# Patient Record
Sex: Male | Born: 1965 | Race: White | Hispanic: No | Smoking: Never smoker
Health system: Southern US, Community
[De-identification: ages and names within clinical notes are randomized; demographics above are authoritative.]

## PROBLEM LIST (undated history)

## (undated) DIAGNOSIS — I10 Essential (primary) hypertension: Secondary | ICD-10-CM

## (undated) DIAGNOSIS — E119 Type 2 diabetes mellitus without complications: Secondary | ICD-10-CM

## (undated) HISTORY — PX: HERNIA REPAIR: SHX51

---

## 1996-05-15 HISTORY — PX: WISDOM TOOTH EXTRACTION: SHX21

## 2014-03-23 ENCOUNTER — Ambulatory Visit: Payer: Self-pay | Admitting: Family Medicine

## 2014-07-22 ENCOUNTER — Ambulatory Visit: Payer: Self-pay | Admitting: Physician Assistant

## 2015-12-22 ENCOUNTER — Ambulatory Visit
Admission: EM | Admit: 2015-12-22 | Discharge: 2015-12-22 | Disposition: A | Discharge status: Home / Self Care | Payer: Self-pay | Attending: Family Medicine | Admitting: Family Medicine

## 2015-12-22 DIAGNOSIS — S0501XA Injury of conjunctiva and corneal abrasion without foreign body, right eye, initial encounter: Secondary | ICD-10-CM

## 2015-12-22 MED ORDER — TETRACAINE HCL 0.5 % OP SOLN: 1.0000 [drp] | Freq: Once | OPHTHALMIC | Status: DC

## 2015-12-22 MED ORDER — FLUORESCEIN SODIUM 1 MG OP STRP: 1.0000 | ORAL_STRIP | Freq: Once | OPHTHALMIC | Status: DC

## 2015-12-22 MED ORDER — ERYTHROMYCIN 5 MG/GM OP OINT: TOPICAL_OINTMENT | OPHTHALMIC | 0 refills | Status: DC

## 2015-12-22 NOTE — Discharge Instructions (Signed)
Use Erythromycin ointment three times a day in the right eye to help prevent infection. Recommend follow-up with an eye doctor if symptoms do not improve within 3 days.

## 2015-12-22 NOTE — ED Provider Notes (Signed)
CSN: [id number]     Arrival date & time 12/22/15  7829 History   First MD Initiated Contact with Patient 12/22/15 1112     Chief Complaint  Patient presents with  . Foreign Body in Eye   (Consider location/radiation/quality/duration/timing/severity/associated sxs/prior Treatment) Patient presents with right eye irritation. Was working with scrap metal at work yesterday when he thought something got into his right eye. He wiped away small dark sand-like particles but still feels like something is in his eye, especially when he blinks. He has not put any eye drops in his eye. He does not wear contacts.    The history is provided by the patient.  Foreign Body in Eye  This is a new problem. The current episode started 12 to 24 hours ago. The problem occurs constantly. The problem has not changed since onset.He has tried nothing for the symptoms.    History reviewed. No pertinent past medical history. Past Surgical History:  Procedure Laterality Date  . HERNIA REPAIR     History reviewed. No pertinent family history. Social History  Substance Use Topics  . Smoking status: Never Smoker  . Smokeless tobacco: Never Used  . Alcohol use Yes    Review of Systems  Eyes: Positive for pain and redness.    Allergies  Review of patient's allergies indicates no known allergies.  Home Medications   Prior to Admission medications   Medication Sig Start Date End Date Taking? Authorizing Provider  erythromycin ophthalmic ointment Place a 1/2 inch ribbon of ointment into the right lower eyelid three times a day for 5 days. 12/22/15   Sudie Grumbling, NP   Meds Ordered and Administered this Visit   Medications - No data to display  BP (!) 151/84 (BP Location: Right Arm)   Pulse 78   Temp 98 F (36.7 C) (Tympanic)   Resp 18   Ht  (1.803 m)   Wt 210 lb (95.3 kg)   SpO2 98%   BMI 29.29 kg/m  No data found.   Physical Exam  Constitutional: He appears well-developed and  well-nourished.  HENT:  Head: Normocephalic and atraumatic.  Nose: Nose normal.  Mouth/Throat: Oropharynx is clear and moist.  Eyes: EOM and lids are normal. Pupils are equal, round, and reactive to light. Lids are everted and swept, no foreign bodies found. Right eye exhibits no discharge (clear tears). Right conjunctiva is injected. Left conjunctiva is not injected.  Slit lamp exam:      The right eye shows corneal abrasion.    Small Corneal abrasion present at 12 o'clock just above pupil. Also tiny abrasions present  lateral around 3 o'clock near tear duct. No distinct foreign bodies seen.   Neck: Normal range of motion. Neck supple.  Cardiovascular: Normal rate and regular rhythm.   Skin: Skin is warm and dry.  Psychiatric: He has a normal mood and affect. His behavior is normal. Judgment and thought content normal.    Urgent Care Course   Clinical Course    Procedures (including critical care time) Applied 2 drops of Tetracaine into right eye. Used fluorescein dye and observed eye with black light. Flushed eye with 2 cc of normal saline.   Labs Review Labs Reviewed - No data to display  Imaging Review No results found.   Visual Acuity Review  Right Eye Distance: 20/20 Left Eye Distance: 20/20 Bilateral Distance: 20/20  Right Eye Near:   Left Eye Near:    Bilateral Near:  MDM   1. Corneal abrasion, right, initial encounter   2. Incidental finding- elevated blood pressure  Discussed findings with patient. No visible foreign bodies still seen. Recommend use Erythromycin ointment 3 times a day for the next 5 days to prevent infection from abrasion. Encouraged to wear protective glasses when working to prevent eye injuries in the future. Patient did not need note for work today. Recommend follow-up with an eye doctor if symptoms do not resolve within 2 to 3 days.  Briefly discussed elevated blood pressure. Recommend recheck BP 2 to 3 times within the next  week. If still elevated, patient needs to see his PCP.     Sudie GrumblingAnn Berry Cordae Mccarey, NP 12/23/15 0020

## 2015-12-22 NOTE — ED Triage Notes (Signed)
Patient was throwing scrap steel at work and something entered his eye yesterday.

## 2017-06-04 ENCOUNTER — Ambulatory Visit: Payer: Self-pay | Admitting: *Deleted

## 2018-04-22 ENCOUNTER — Other Ambulatory Visit: Payer: Self-pay | Admitting: Internal Medicine

## 2018-04-22 ENCOUNTER — Ambulatory Visit (INDEPENDENT_AMBULATORY_CARE_PROVIDER_SITE_OTHER): Payer: Worker's Compensation

## 2018-04-22 ENCOUNTER — Encounter: Payer: Self-pay | Admitting: Emergency Medicine

## 2018-04-22 ENCOUNTER — Ambulatory Visit
Admission: EM | Admit: 2018-04-22 | Discharge: 2018-04-22 | Disposition: A | Discharge status: Home / Self Care | Payer: Worker's Compensation | Attending: Family Medicine | Admitting: Family Medicine

## 2018-04-22 ENCOUNTER — Other Ambulatory Visit: Payer: Self-pay

## 2018-04-22 DIAGNOSIS — W228XXA Striking against or struck by other objects, initial encounter: Secondary | ICD-10-CM | POA: Diagnosis not present

## 2018-04-22 DIAGNOSIS — S6991XA Unspecified injury of right wrist, hand and finger(s), initial encounter: Secondary | ICD-10-CM

## 2018-04-22 HISTORY — DX: Type 2 diabetes mellitus without complications: E11.9

## 2018-04-22 MED ORDER — MELOXICAM 15 MG PO TABS: 15.0000 mg | ORAL_TABLET | Freq: Every day | ORAL | 0 refills | Status: DC | PRN

## 2018-04-22 NOTE — Discharge Instructions (Signed)
Medication as prescribed.  Take care  Dr. Chalee Hirota  

## 2018-04-22 NOTE — ED Provider Notes (Signed)
[facility]    CSN: [id number] Arrival date & time: 04/22/18  1406  History   Chief Complaint Chief Complaint  Patient presents with  . Finger Injury    DOI 04/08/18   HPI  52 year old male presents with right thumb pain.  Patient states that he hyperextended his right thumb accidentally on 11/25.  Occurred at work.  He states that his glove got caught and bent his thumb backwards.  Patient states that he was doing okay but hit his thumb today at work.  Pain is mild.  Worse with activity.  No relieving factors.  No other complaints.  History reviewed as below. Past Medical History:  Diagnosis Date  . Diabetes mellitus without complication [facility])    Past Surgical History:  Procedure Laterality Date  . HERNIA REPAIR     Home Medications    Prior to Admission medications   Medication Sig Start Date End Date Taking? Authorizing Provider  metFORMIN (GLUCOPHAGE) 1000 MG tablet Take by mouth. 01/18/18  Yes [provider]  JARDIANCE 25 MG TABS tablet  04/22/18   [provider]  lisinopril (PRINIVIL,ZESTRIL) 20 MG tablet  04/10/18   [provider]  meloxicam (MOBIC) 15 MG tablet Take 1 tablet (15 mg total) by mouth daily as needed. 04/22/18   Shelley Pooley G, DO  TRULICITY 0.75 MG/0.5ML SOPN INJECT 0.75 MG SUBCUTANEOUSLY Q 7 DAYS 04/10/18   [provider]   Social History Social History   Tobacco Use  . Smoking status: Never Smoker  . Smokeless tobacco: Never Used  Substance Use Topics  . Alcohol use: Yes  . Drug use: No     Allergies   Patient has no known allergies.   Review of Systems Review of Systems  Constitutional: Negative.   Musculoskeletal:       Right thumb pain.   Physical Exam Triage Vital Signs ED Triage Vitals  Enc Vitals Group     BP 04/22/18 1434 136/81     Pulse Rate 04/22/18 1434 93     Resp 04/22/18 1434 18     Temp 04/22/18 1434 98.8 F (37.1 C)     Temp Source 04/22/18 1434 Oral     SpO2  04/22/18 1434 97 %     Weight 04/22/18 1431 220 lb (99.8 kg)     Height 04/22/18 1431 5\' 10"  (1.778 m)     Head Circumference --      Peak Flow --      Pain Score 04/22/18 1431 1     Pain Loc --      Pain Edu? --      Excl. in GC? --    Updated Vital Signs BP 136/81 (BP Location: Right Arm)   Pulse 93   Temp 98.8 F (37.1 C) (Oral)   Resp 18   Ht 5\' 10"  (1.778 m)   Wt 99.8 kg   SpO2 97%   BMI 31.57 kg/m   Visual Acuity Right Eye Distance:   Left Eye Distance:   Bilateral Distance:    Right Eye Near:   Left Eye Near:    Bilateral Near:     Physical Exam  Constitutional: He is oriented to person, place, and time. He appears well-developed. No distress.  HENT:  Head: Normocephalic and atraumatic.  Pulmonary/Chest: Effort normal. No respiratory distress.  Musculoskeletal:  Right thumb -no swelling.  No erythema.  No discrete areas of tenderness.  Neurological: He is alert and oriented to person, place, and  time.  Psychiatric: He has a normal mood and affect. His behavior is normal.  Nursing note and vitals reviewed.  UC Treatments / Results  Labs (all labs ordered are listed, but only abnormal results are displayed) Labs Reviewed - No data to display  EKG None  Radiology Dg Finger Thumb Right  Result Date: 04/22/2018 CLINICAL DATA:  52 year old male hyperflexion injury 2 weeks ago with continued 1st MCP and IP pain. EXAM: RIGHT THUMB 2+V COMPARISON:  Right hand series 07/22/2014. FINDINGS: Three views of the right thumb. Bone mineralization is within normal limits. There is no evidence of fracture or dislocation. There is no evidence of arthropathy or other focal bone abnormality. Soft tissues are unremarkable IMPRESSION: Negative. Electronically Signed   By: Odessa FlemingH  Hall M.D.   On: 04/22/2018 14:56    Procedures Procedures (including critical care time)  Medications Ordered in UC Medications - No data to display  Initial Impression / Assessment and Plan / UC  Course  I have reviewed the triage vital signs and the nursing notes.  Pertinent labs & imaging results that were available during my care of the patient were reviewed by me and considered in my medical decision making (see chart for details).    52 year old male presents with a thumb injury.  X-ray negative.  Meloxicam as prescribed.  May return to work.  Final Clinical Impressions(s) / UC Diagnoses   Final diagnoses:  Injury of right thumb, initial encounter     Discharge Instructions     Medication as prescribed.  Take care  Dr. Adriana Simasook    ED Prescriptions    Medication Sig Dispense Auth. Provider   meloxicam (MOBIC) 15 MG tablet Take 1 tablet (15 mg total) by mouth daily as needed. 30 tablet Tommie Samsook, Arabell Neria G, DO     Controlled Substance Prescriptions [place] Controlled Substance Registry consulted? Not Applicable   Tommie SamsCook, Jakub Debold G, DO 04/22/18 2043

## 2018-04-22 NOTE — ED Triage Notes (Signed)
Patient c/o right thumb injury on 11/25 while at work. Patient states he got his glove caught and he bent his thumb back. Patient continues to have pain.

## 2020-02-04 ENCOUNTER — Emergency Department
Admission: EM | Admit: 2020-02-04 | Discharge: 2020-02-04 | Disposition: A | Discharge status: Home / Self Care | Payer: BC Managed Care – PPO | Attending: Emergency Medicine | Admitting: Emergency Medicine

## 2020-02-04 ENCOUNTER — Other Ambulatory Visit: Payer: Self-pay

## 2020-02-04 DIAGNOSIS — E119 Type 2 diabetes mellitus without complications: Secondary | ICD-10-CM | POA: Diagnosis not present

## 2020-02-04 DIAGNOSIS — K0889 Other specified disorders of teeth and supporting structures: Secondary | ICD-10-CM | POA: Diagnosis not present

## 2020-02-04 DIAGNOSIS — Z7984 Long term (current) use of oral hypoglycemic drugs: Secondary | ICD-10-CM | POA: Diagnosis not present

## 2020-02-04 DIAGNOSIS — Z79899 Other long term (current) drug therapy: Secondary | ICD-10-CM | POA: Insufficient documentation

## 2020-02-04 DIAGNOSIS — R55 Syncope and collapse: Secondary | ICD-10-CM | POA: Diagnosis not present

## 2020-02-04 LAB — CBC
HCT: 43 % (ref 39.0–52.0)
Hemoglobin: 15.1 g/dL (ref 13.0–17.0)
MCH: 30.7 pg (ref 26.0–34.0)
MCHC: 35.1 g/dL (ref 30.0–36.0)
MCV: 87.4 fL (ref 80.0–100.0)
Platelets: 332 10*3/uL (ref 150–400)
RBC: 4.92 MIL/uL (ref 4.22–5.81)
RDW: 11.9 % (ref 11.5–15.5)
WBC: 15.8 10*3/uL — ABNORMAL HIGH (ref 4.0–10.5)
nRBC: 0 % (ref 0.0–0.2)

## 2020-02-04 LAB — URINALYSIS, COMPLETE (UACMP) WITH MICROSCOPIC
Bacteria, UA: NONE SEEN
Bilirubin Urine: NEGATIVE
Glucose, UA: 50 mg/dL — AB
Hgb urine dipstick: NEGATIVE
Ketones, ur: 80 mg/dL — AB
Leukocytes,Ua: NEGATIVE
Nitrite: NEGATIVE
Protein, ur: NEGATIVE mg/dL
Specific Gravity, Urine: 1.036 — ABNORMAL HIGH (ref 1.005–1.030)
Squamous Epithelial / HPF: NONE SEEN (ref 0–5)
pH: 5 (ref 5.0–8.0)

## 2020-02-04 LAB — BASIC METABOLIC PANEL
Anion gap: 15 (ref 5–15)
BUN: 20 mg/dL (ref 6–20)
CO2: 21 mmol/L — ABNORMAL LOW (ref 22–32)
Calcium: 9.1 mg/dL (ref 8.9–10.3)
Chloride: 102 mmol/L (ref 98–111)
Creatinine, Ser: 1.12 mg/dL (ref 0.61–1.24)
GFR calc Af Amer: >60 mL/min (ref >60)
GFR calc non Af Amer: >60 mL/min (ref >60)
Glucose, Bld: 137 mg/dL — ABNORMAL HIGH (ref 70–99)
Potassium: 4.6 mmol/L (ref 3.5–5.1)
Sodium: 138 mmol/L (ref 135–145)

## 2020-02-04 MED ORDER — TRAMADOL HCL 50 MG PO TABS
50.0000 mg | ORAL_TABLET | Freq: Once | ORAL | Status: AC
  Administered 2020-02-04: 50 mg via ORAL
  Filled 2020-02-04: qty 1

## 2020-02-04 NOTE — Discharge Instructions (Addendum)
Your exam and labs are essentially normal at this time. Your elevated WBCs are likely due to your recent dental procedure. Take the prescribed pain medicine or Extra Strength Tylenol for non-drowsy pain relief. Follow-up with your dental provider or return if needed.
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
## 2020-05-05 ENCOUNTER — Other Ambulatory Visit: Payer: Self-pay

## 2020-05-05 ENCOUNTER — Encounter: Payer: Self-pay | Admitting: *Deleted

## 2020-05-05 DIAGNOSIS — R079 Chest pain, unspecified: Secondary | ICD-10-CM | POA: Diagnosis present

## 2020-05-05 DIAGNOSIS — I10 Essential (primary) hypertension: Secondary | ICD-10-CM | POA: Insufficient documentation

## 2020-05-05 DIAGNOSIS — K5900 Constipation, unspecified: Secondary | ICD-10-CM | POA: Diagnosis not present

## 2020-05-05 DIAGNOSIS — E119 Type 2 diabetes mellitus without complications: Secondary | ICD-10-CM | POA: Insufficient documentation

## 2020-05-05 DIAGNOSIS — R202 Paresthesia of skin: Secondary | ICD-10-CM | POA: Insufficient documentation

## 2020-05-05 DIAGNOSIS — Z7984 Long term (current) use of oral hypoglycemic drugs: Secondary | ICD-10-CM | POA: Insufficient documentation

## 2020-05-05 DIAGNOSIS — Z79899 Other long term (current) drug therapy: Secondary | ICD-10-CM | POA: Insufficient documentation

## 2020-05-05 LAB — CBC
HCT: 43.7 % (ref 39.0–52.0)
Hemoglobin: 15.2 g/dL (ref 13.0–17.0)
MCH: 31.3 pg (ref 26.0–34.0)
MCHC: 34.8 g/dL (ref 30.0–36.0)
MCV: 90.1 fL (ref 80.0–100.0)
Platelets: 321 10*3/uL (ref 150–400)
RBC: 4.85 MIL/uL (ref 4.22–5.81)
RDW: 11.7 % (ref 11.5–15.5)
WBC: 10.1 10*3/uL (ref 4.0–10.5)
nRBC: 0 % (ref 0.0–0.2)

## 2020-05-05 NOTE — ED Triage Notes (Signed)
Pain in the central chest and on the left lateral chest that "last for seconds at a time" since yesterday. Tingling in the left arm. Has had some nausea.

## 2020-05-06 ENCOUNTER — Emergency Department
Admission: EM | Admit: 2020-05-06 | Discharge: 2020-05-06 | Disposition: A | Discharge status: Home / Self Care | Payer: BC Managed Care – PPO | Attending: Emergency Medicine | Admitting: Emergency Medicine

## 2020-05-06 ENCOUNTER — Emergency Department: Payer: BC Managed Care – PPO

## 2020-05-06 DIAGNOSIS — R079 Chest pain, unspecified: Secondary | ICD-10-CM

## 2020-05-06 LAB — BASIC METABOLIC PANEL
Anion gap: 12 (ref 5–15)
BUN: 29 mg/dL — ABNORMAL HIGH (ref 6–20)
CO2: 24 mmol/L (ref 22–32)
Calcium: 9.4 mg/dL (ref 8.9–10.3)
Chloride: 100 mmol/L (ref 98–111)
Creatinine, Ser: 1.4 mg/dL — ABNORMAL HIGH (ref 0.61–1.24)
GFR, Estimated: >60 mL/min (ref >60)
Glucose, Bld: 123 mg/dL — ABNORMAL HIGH (ref 70–99)
Potassium: 4.3 mmol/L (ref 3.5–5.1)
Sodium: 136 mmol/L (ref 135–145)

## 2020-05-06 LAB — TROPONIN I (HIGH SENSITIVITY)
Troponin I (High Sensitivity): 3 ng/L (ref <18)
Troponin I (High Sensitivity): 4 ng/L (ref <18)

## 2020-05-06 MED ORDER — PANTOPRAZOLE SODIUM 40 MG PO TBEC: 40.0000 mg | DELAYED_RELEASE_TABLET | Freq: Every day | ORAL | 0 refills | Status: DC

## 2020-05-06 NOTE — ED Notes (Signed)
Patient discharged to home per MD order. Patient in stable condition, and deemed medically cleared by ED provider for discharge. Discharge instructions reviewed with patient/family using "Teach Back"; verbalized understanding of medication education and administration, and information about follow-up care. Denies further concerns. ° °

## 2020-05-06 NOTE — ED Provider Notes (Signed)
[facility] Emergency Department Provider Note  ____________________________________________   None    (approximate)  I have reviewed the triage vital signs and the nursing notes.   HISTORY  Chief Complaint Chest Pain   HPI Alfred Cortez is a 54 y.o. male with a past medical history of HTN and DM who presents for assessment of approximately week of intermittent left-sided chest pain with some radiation to left arm and some paresthesias in left arm.  Patient states that a couple times over the last week he has woken at night with some burning in his chest and some nausea.  He has a states he has been having some constipation.  He states he is intermittently had some abdominal discomfort of past week but does not currently have any abdominal pain.  He denies any headache, earache, sore throat, fevers, chills, cough, shortness of breath, back pain, urinary symptoms, blood in stool, rash or extremity pain.  He endorses remote tobacco abuse but is not a current smoker or daily drinker and does not use illegal drugs.  No other acute concerns at this time.  Pain is not clearly exertional, positional or precipitated by any specific movements.         Past Medical History:  Diagnosis Date  . Diabetes mellitus without complication (HCC)     There are no problems to display for this patient.   Past Surgical History:  Procedure Laterality Date  . HERNIA REPAIR    . WISDOM TOOTH EXTRACTION  1998    Prior to Admission medications   Medication Sig Start Date End Date Taking? Authorizing Provider  JARDIANCE 25 MG TABS tablet  04/22/18   [provider]  lisinopril (PRINIVIL,ZESTRIL) 20 MG tablet  04/10/18   [provider]  meloxicam (MOBIC) 15 MG tablet Take 1 tablet (15 mg total) by mouth daily as needed. 04/22/18   Tommie Sams, DO  metFORMIN (GLUCOPHAGE) 1000 MG tablet Take by mouth. 01/18/18   [provider]  pantoprazole (PROTONIX)  40 MG tablet Take 1 tablet (40 mg total) by mouth daily. 05/06/20 06/05/20  Gilles Chiquito, MD  TRULICITY 0.75 MG/0.5ML SOPN INJECT 0.75 MG SUBCUTANEOUSLY Q 7 DAYS 04/10/18   [provider]    Allergies Patient has no known allergies.  No family history on file.  Social History Social History   Tobacco Use  . Smoking status: Never Smoker  . Smokeless tobacco: Never Used  Vaping Use  . Vaping Use: Never used  Substance Use Topics  . Alcohol use: Yes  . Drug use: No    Review of Systems  Review of Systems  Constitutional: Negative for chills and fever.  HENT: Negative for sore throat.   Eyes: Negative for pain.  Respiratory: Negative for cough and stridor.   Cardiovascular: Positive for chest pain.  Gastrointestinal: Positive for abdominal pain ( intermittent over past week, not at present) and constipation. Negative for vomiting.  Genitourinary: Negative for dysuria.  Musculoskeletal: Negative for myalgias.  Skin: Negative for rash.  Neurological: Negative for seizures, loss of consciousness and headaches.  Psychiatric/Behavioral: Negative for suicidal ideas.  All other systems reviewed and are negative.     ____________________________________________   PHYSICAL EXAM:  VITAL SIGNS: ED Triage Vitals  Enc Vitals Group     BP 05/05/20 2343 (!) 169/83     Pulse Rate 05/05/20 2343 89     Resp 05/05/20 2343 16     Temp 05/05/20 2343 98.5 F (36.9  C)     Temp Source 05/05/20 2343 Oral     SpO2 05/05/20 2343 98 %     Weight 05/05/20 2342 215 lb (97.5 kg)     Height 05/05/20 2342 5\' 10"  (1.778 m)     Head Circumference --      Peak Flow --      Pain Score 05/05/20 2343 0     Pain Loc --      Pain Edu? --      Excl. in GC? --    Vitals:   05/05/20 2343 05/06/20 0158  BP: (!) 169/83 130/90  Pulse: 89 93  Resp: 16 18  Temp: 98.5 F (36.9 C)   SpO2: 98% 99%   Physical Exam Vitals and nursing note reviewed.  Constitutional:      Appearance: He  is well-developed and well-nourished.  HENT:     Head: Normocephalic and atraumatic.     Right Ear: External ear normal.     Left Ear: External ear normal.     Nose: Nose normal.     Mouth/Throat:     Mouth: Mucous membranes are moist.  Eyes:     Conjunctiva/sclera: Conjunctivae normal.  Cardiovascular:     Rate and Rhythm: Normal rate and regular rhythm.     Heart sounds: No murmur heard.   Pulmonary:     Effort: Pulmonary effort is normal. No respiratory distress.     Breath sounds: Normal breath sounds.  Abdominal:     Palpations: Abdomen is soft.     Tenderness: There is no abdominal tenderness.  Musculoskeletal:        General: No edema.     Cervical back: Neck supple.     Right lower leg: No edema.     Left lower leg: No edema.  Skin:    General: Skin is warm and dry.     Capillary Refill: Capillary refill takes less than 2 seconds.  Neurological:     Mental Status: He is alert and oriented to person, place, and time.  Psychiatric:        Mood and Affect: Mood and affect and mood normal.      ____________________________________________   LABS (all labs ordered are listed, but only abnormal results are displayed)  Labs Reviewed  BASIC METABOLIC PANEL - Abnormal; Notable for the following components:      Result Value   Glucose, Bld 123 (*)    BUN 29 (*)    Creatinine, Ser 1.40 (*)    All other components within normal limits  CBC  TROPONIN I (HIGH SENSITIVITY)  TROPONIN I (HIGH SENSITIVITY)   ____________________________________________  EKG  Sinus rhythm with a ventricular rate of 88, normal axis, unremarkable intervals, no clear evidence of acute ischemia or other significant underlying arrhythmia. ____________________________________________  RADIOLOGY  ED MD interpretation: No focal consolidation, overt edema, large effusion, pneumothorax or other acute intrathoracic process.  Official radiology report(s): DG Chest 2 View  Result Date:  05/06/2020 CLINICAL DATA:  Chest pain, central and left lateral lasting several seconds, associated left arm tingling EXAM: CHEST - 2 VIEW COMPARISON:  None. FINDINGS: Bandlike opacity along the left heart border, likely subsegmental atelectasis. No consolidation, features of edema, pneumothorax, or effusion. The cardiomediastinal contours are unremarkable. No acute osseous or soft tissue abnormality. Degenerative changes are present in the imaged spine and shoulders. IMPRESSION: Bandlike opacity along the left heart border, likely subsegmental atelectasis in the lingula. Electronically Signed   By: 05/08/2020  M.D.   On: 05/06/2020 00:30    ____________________________________________   PROCEDURES  Procedure(s) performed (including Critical Care):  Procedures   ____________________________________________   INITIAL IMPRESSION / ASSESSMENT AND PLAN / ED COURSE      Patient presents for assessment of some intermittent chest pain as described above in the setting of some constipation although patient is having bowel movements.  He is afebrile and hemodynamically stable on arrival.  Primary differential includes but is not limited to ACS, arrhythmia, symptomatic anemia, pericarditis, myocarditis, PE, pneumonia, pneumothorax and GERD/esophagitis.  ECG shows no evidence of arrhythmia.  There are some nonspecific changes but below suspicion for ACS given 2 nonelevated troponins obtained over 2 hours.  ECG troponin and history is not consistent with pericarditis or myocarditis.  Low suspicion for PE given description of pain and not at present with no evidence of tachycardia, hypoxia, tachypnea or other clear risk factors or associated suspicious symptoms.  Chest x-ray shows no evidence of pneumonia, pneumothorax or other acute thoracic process.  Description of symptoms especially with burning and nausea at night in the setting of some constipation are concerning for possible reflux with  esophagitis.  Rx written for Protonix.  Advised patient to follow-up with his PCP for further evaluation management.  Discharged stable condition.  Strict return precautions advised and discussed.    ____________________________________________   FINAL CLINICAL IMPRESSION(S) / ED DIAGNOSES  Final diagnoses:  Chest pain, unspecified type    Medications - No data to display   ED Discharge Orders         Ordered    pantoprazole (PROTONIX) 40 MG tablet  Daily        05/06/20 0413           Note:  This document was prepared using Dragon voice recognition software and may include unintentional dictation errors.   Gilles Chiquito, MD 05/06/20 [phone]

## 2020-11-29 IMAGING — CR DG FINGER THUMB 2+V*R*
4 series · 4 of 4 positions shown · non-contrast
Comparison: Right hand series 07/22/2014.

CLINICAL DATA: 52-year-old male hyperflexion injury 2 weeks ago
with continued 1st MCP and IP pain.

EXAM:
RIGHT THUMB 2+V

[finger ap (1 of 2)]
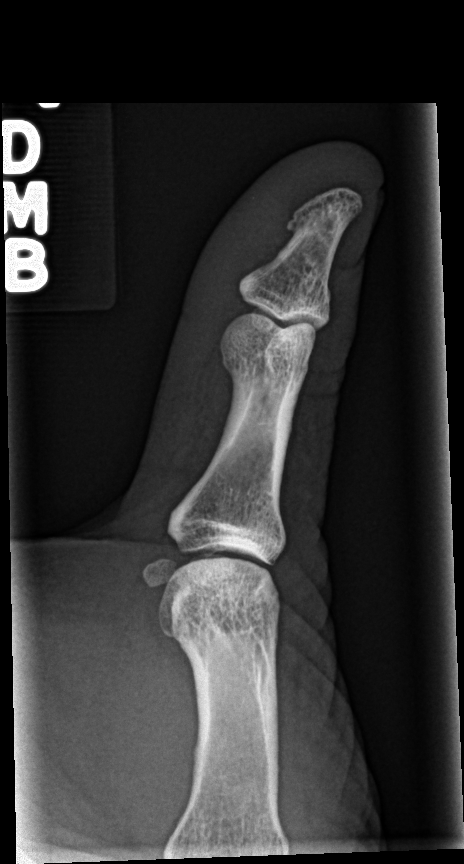

[finger obl]
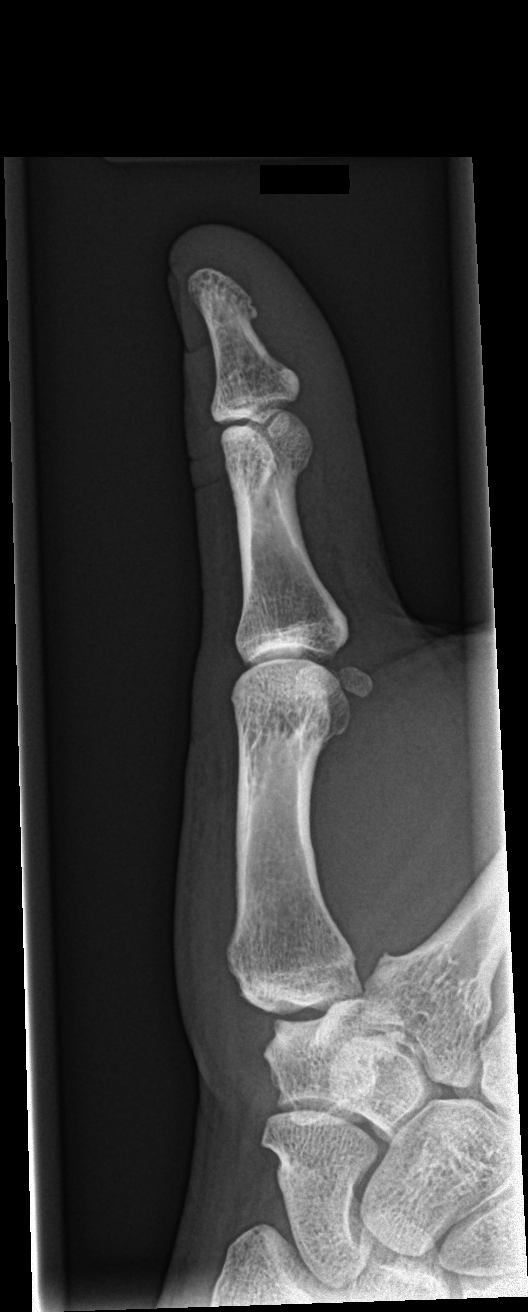

[finger lat]
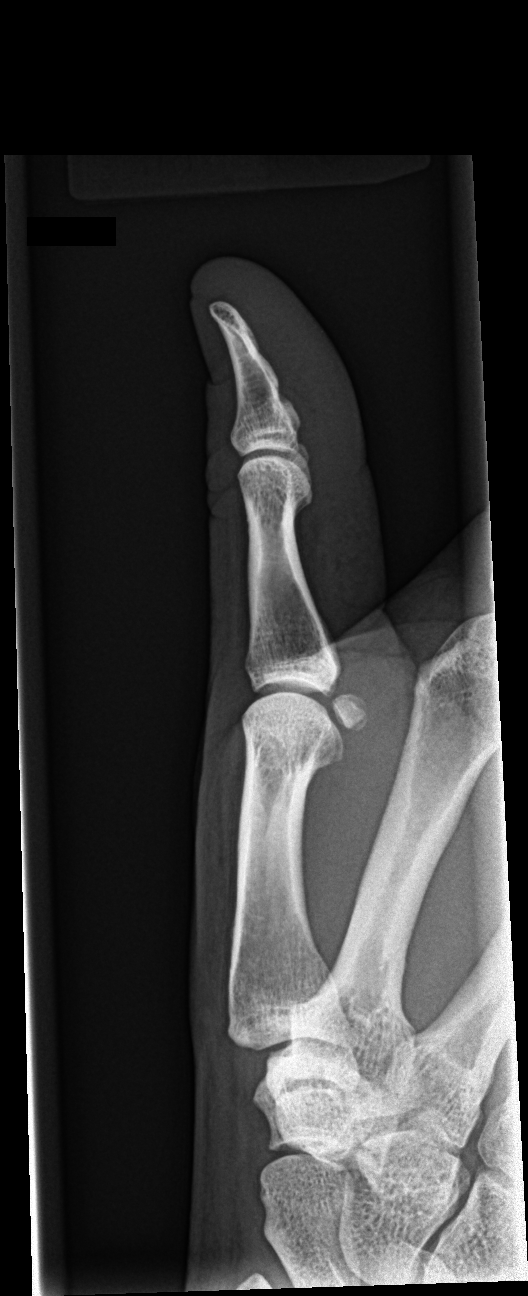

[finger ap (2 of 2)]
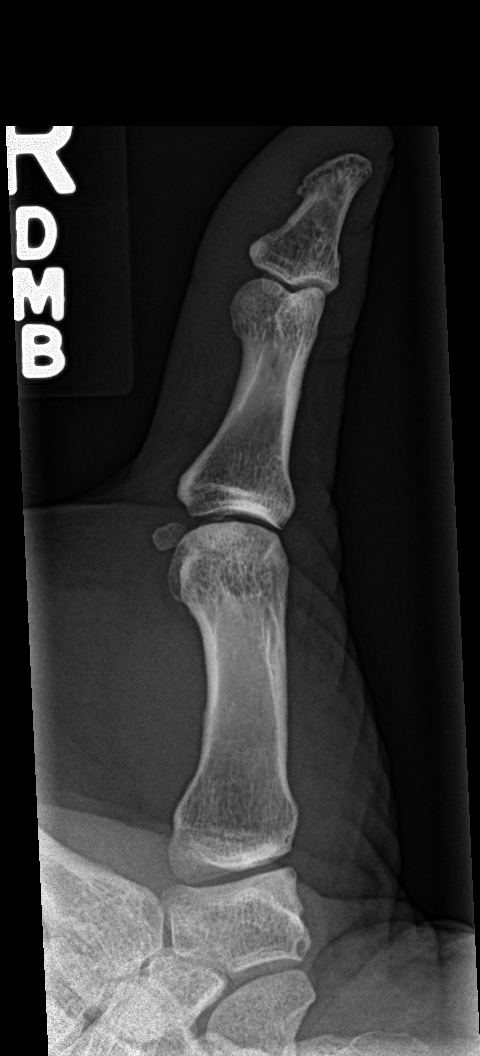

[4 of 4 positions shown; findings below may reference images not displayed]

FINDINGS: Three views of the right thumb. Bone mineralization is within normal
limits. There is no evidence of fracture or dislocation. There is no
evidence of arthropathy or other focal bone abnormality. Soft
tissues are unremarkable
IMPRESSION: Negative.

## 2021-09-24 ENCOUNTER — Other Ambulatory Visit: Payer: Self-pay

## 2021-09-24 DIAGNOSIS — R1031 Right lower quadrant pain: Secondary | ICD-10-CM | POA: Diagnosis present

## 2021-09-24 DIAGNOSIS — Z7984 Long term (current) use of oral hypoglycemic drugs: Secondary | ICD-10-CM | POA: Diagnosis not present

## 2021-09-24 DIAGNOSIS — K3580 Unspecified acute appendicitis: Principal | ICD-10-CM | POA: Insufficient documentation

## 2021-09-24 DIAGNOSIS — E119 Type 2 diabetes mellitus without complications: Secondary | ICD-10-CM | POA: Insufficient documentation

## 2021-09-24 LAB — COMPREHENSIVE METABOLIC PANEL
ALT: 25 U/L (ref 0–44)
AST: 19 U/L (ref 15–41)
Albumin: 4.3 g/dL (ref 3.5–5.0)
Alkaline Phosphatase: 87 U/L (ref 38–126)
Anion gap: 10 (ref 5–15)
BUN: 26 mg/dL — ABNORMAL HIGH (ref 6–20)
CO2: 22 mmol/L (ref 22–32)
Calcium: 9.2 mg/dL (ref 8.9–10.3)
Chloride: 102 mmol/L (ref 98–111)
Creatinine, Ser: 0.97 mg/dL (ref 0.61–1.24)
GFR, Estimated: >60 mL/min (ref >60)
Glucose, Bld: 135 mg/dL — ABNORMAL HIGH (ref 70–99)
Potassium: 4.2 mmol/L (ref 3.5–5.1)
Sodium: 134 mmol/L — ABNORMAL LOW (ref 135–145)
Total Bilirubin: 0.7 mg/dL (ref 0.3–1.2)
Total Protein: 7.5 g/dL (ref 6.5–8.1)

## 2021-09-24 LAB — CBC
HCT: 44.6 % (ref 39.0–52.0)
Hemoglobin: 15.1 g/dL (ref 13.0–17.0)
MCH: 29.7 pg (ref 26.0–34.0)
MCHC: 33.9 g/dL (ref 30.0–36.0)
MCV: 87.6 fL (ref 80.0–100.0)
Platelets: 311 10*3/uL (ref 150–400)
RBC: 5.09 MIL/uL (ref 4.22–5.81)
RDW: 11.7 % (ref 11.5–15.5)
WBC: 14.9 10*3/uL — ABNORMAL HIGH (ref 4.0–10.5)
nRBC: 0 % (ref 0.0–0.2)

## 2021-09-24 LAB — URINALYSIS, ROUTINE W REFLEX MICROSCOPIC
Bacteria, UA: NONE SEEN
Bilirubin Urine: NEGATIVE
Glucose, UA: >=500 mg/dL — AB
Hgb urine dipstick: NEGATIVE
Ketones, ur: NEGATIVE mg/dL
Leukocytes,Ua: NEGATIVE
Nitrite: NEGATIVE
Protein, ur: NEGATIVE mg/dL
Specific Gravity, Urine: 1.027 (ref 1.005–1.030)
WBC, UA: NONE SEEN WBC/hpf (ref 0–5)
pH: 5 (ref 5.0–8.0)

## 2021-09-24 LAB — LIPASE, BLOOD: Lipase: 39 U/L (ref 11–51)

## 2021-09-24 NOTE — ED Triage Notes (Signed)
PT arrives today from home with LRQ pain that started 8 hours ago. Pt  able to eat and drink normally. Pt has not taken anything for pain. Pt endorsing low grade fever T max at home 99.9 ?

## 2021-09-24 NOTE — ED Triage Notes (Signed)
First Rn Note: Pt to ED via POV, states RLQ abd pain that started earlier today. Pt states pain slowly increasing throughout the day. Pt states he thinks his appendix has "ruptured or is going to rupture". Pt A&O x4, ambulatory without difficulty to triage desk.  ?

## 2021-09-25 ENCOUNTER — Other Ambulatory Visit: Payer: Self-pay

## 2021-09-25 ENCOUNTER — Observation Stay: Payer: BC Managed Care – PPO | Admitting: Anesthesiology

## 2021-09-25 ENCOUNTER — Encounter
Admission: EM | Disposition: A | Discharge status: Home / Self Care | Payer: Self-pay | Source: Home / Self Care | Attending: Emergency Medicine

## 2021-09-25 ENCOUNTER — Observation Stay
Admission: EM | Admit: 2021-09-25 | Discharge: 2021-09-25 | Disposition: A | Discharge status: Home / Self Care | Payer: BC Managed Care – PPO | Attending: General Surgery | Admitting: General Surgery

## 2021-09-25 ENCOUNTER — Emergency Department: Payer: BC Managed Care – PPO

## 2021-09-25 ENCOUNTER — Encounter: Payer: Self-pay | Admitting: General Surgery

## 2021-09-25 DIAGNOSIS — K3580 Unspecified acute appendicitis: Secondary | ICD-10-CM | POA: Diagnosis present

## 2021-09-25 HISTORY — PX: XI ROBOTIC LAPAROSCOPIC ASSISTED APPENDECTOMY: SHX6877

## 2021-09-25 LAB — GLUCOSE, CAPILLARY: Glucose-Capillary: 142 mg/dL — ABNORMAL HIGH (ref 70–99)

## 2021-09-25 LAB — HIV ANTIBODY (ROUTINE TESTING W REFLEX): HIV Screen 4th Generation wRfx: NONREACTIVE

## 2021-09-25 SURGERY — APPENDECTOMY, ROBOT-ASSISTED, LAPAROSCOPIC
Anesthesia: General | Site: Abdomen

## 2021-09-25 MED ORDER — LISINOPRIL 20 MG PO TABS
20.0000 mg | ORAL_TABLET | Freq: Every day | ORAL | Status: DC
  Administered 2021-09-25: 20 mg via ORAL
  Filled 2021-09-25: qty 1

## 2021-09-25 MED ORDER — ONDANSETRON HCL 4 MG/2ML IJ SOLN
4.0000 mg | Freq: Once | INTRAMUSCULAR | Status: DC
Start: 2021-09-25 — End: 2021-09-25

## 2021-09-25 MED ORDER — ACETAMINOPHEN 10 MG/ML IV SOLN
INTRAVENOUS | Status: DC | PRN
  Administered 2021-09-25: 1000 mg via INTRAVENOUS

## 2021-09-25 MED ORDER — FENTANYL CITRATE (PF) 100 MCG/2ML IJ SOLN
INTRAMUSCULAR | Status: DC | PRN
  Administered 2021-09-25 (×2): 50 ug via INTRAVENOUS

## 2021-09-25 MED ORDER — ROCURONIUM BROMIDE 10 MG/ML (PF) SYRINGE
PREFILLED_SYRINGE | INTRAVENOUS | Status: AC
  Filled 2021-09-25: qty 10

## 2021-09-25 MED ORDER — MIDAZOLAM HCL 2 MG/2ML IJ SOLN
INTRAMUSCULAR | Status: DC | PRN
  Administered 2021-09-25: 2 mg via INTRAVENOUS

## 2021-09-25 MED ORDER — ACETAMINOPHEN 325 MG PO TABS: 650.0000 mg | ORAL_TABLET | Freq: Four times a day (QID) | ORAL | Status: DC | PRN

## 2021-09-25 MED ORDER — MIDAZOLAM HCL 2 MG/2ML IJ SOLN
INTRAMUSCULAR | Status: AC
  Filled 2021-09-25: qty 2

## 2021-09-25 MED ORDER — DEXAMETHASONE SODIUM PHOSPHATE 10 MG/ML IJ SOLN
INTRAMUSCULAR | Status: DC | PRN
  Administered 2021-09-25: 10 mg via INTRAVENOUS

## 2021-09-25 MED ORDER — SODIUM CHLORIDE 0.9 % IV SOLN: INTRAVENOUS | Status: DC

## 2021-09-25 MED ORDER — SODIUM CHLORIDE 0.9 % IV SOLN
INTRAVENOUS | Status: DC | PRN
Start: 2021-09-25 — End: 2021-09-25

## 2021-09-25 MED ORDER — HYDROCODONE-ACETAMINOPHEN 5-325 MG PO TABS: 1.0000 | ORAL_TABLET | ORAL | Status: DC | PRN

## 2021-09-25 MED ORDER — FENTANYL CITRATE (PF) 100 MCG/2ML IJ SOLN
25.0000 ug | INTRAMUSCULAR | Status: DC | PRN
  Administered 2021-09-25: 25 ug via INTRAVENOUS
  Administered 2021-09-25: 50 ug via INTRAVENOUS

## 2021-09-25 MED ORDER — HYDROCODONE-ACETAMINOPHEN 5-325 MG PO TABS
1.0000 | ORAL_TABLET | ORAL | 0 refills | Status: AC | PRN
Start: 2021-09-25 — End: 2021-09-28

## 2021-09-25 MED ORDER — ONDANSETRON 4 MG PO TBDP: 4.0000 mg | ORAL_TABLET | Freq: Four times a day (QID) | ORAL | Status: DC | PRN

## 2021-09-25 MED ORDER — IOHEXOL 300 MG/ML  SOLN
100.0000 mL | Freq: Once | INTRAMUSCULAR | Status: AC | PRN
  Administered 2021-09-25: 100 mL via INTRAVENOUS

## 2021-09-25 MED ORDER — ONDANSETRON HCL 4 MG/2ML IJ SOLN
INTRAMUSCULAR | Status: AC
  Filled 2021-09-25: qty 2

## 2021-09-25 MED ORDER — BUPIVACAINE-EPINEPHRINE (PF) 0.5% -1:200000 IJ SOLN
INTRAMUSCULAR | Status: DC | PRN
  Administered 2021-09-25: 30 mL

## 2021-09-25 MED ORDER — DEXAMETHASONE SODIUM PHOSPHATE 10 MG/ML IJ SOLN
INTRAMUSCULAR | Status: AC
  Filled 2021-09-25: qty 1

## 2021-09-25 MED ORDER — OXYCODONE HCL 5 MG PO TABS: 5.0000 mg | ORAL_TABLET | Freq: Once | ORAL | Status: DC | PRN

## 2021-09-25 MED ORDER — ENOXAPARIN SODIUM 40 MG/0.4ML IJ SOSY
40.0000 mg | PREFILLED_SYRINGE | INTRAMUSCULAR | Status: DC
  Administered 2021-09-25: 40 mg via SUBCUTANEOUS
  Filled 2021-09-25: qty 0.4

## 2021-09-25 MED ORDER — CEFAZOLIN SODIUM 1 G IJ SOLR
INTRAMUSCULAR | Status: AC
  Filled 2021-09-25: qty 20

## 2021-09-25 MED ORDER — CEFAZOLIN SODIUM-DEXTROSE 2-3 GM-%(50ML) IV SOLR
INTRAVENOUS | Status: DC | PRN
  Administered 2021-09-25: 2 g via INTRAVENOUS

## 2021-09-25 MED ORDER — 0.9 % SODIUM CHLORIDE (POUR BTL) OPTIME
TOPICAL | Status: DC | PRN
  Administered 2021-09-25: 30 mL

## 2021-09-25 MED ORDER — PROMETHAZINE HCL 25 MG/ML IJ SOLN: 6.2500 mg | INTRAMUSCULAR | Status: DC | PRN

## 2021-09-25 MED ORDER — ONDANSETRON HCL 4 MG/2ML IJ SOLN
INTRAMUSCULAR | Status: DC | PRN
  Administered 2021-09-25: 4 mg via INTRAVENOUS

## 2021-09-25 MED ORDER — ACETAMINOPHEN 650 MG RE SUPP: 650.0000 mg | Freq: Four times a day (QID) | RECTAL | Status: DC | PRN

## 2021-09-25 MED ORDER — LIDOCAINE HCL (PF) 2 % IJ SOLN
INTRAMUSCULAR | Status: AC
  Filled 2021-09-25: qty 5

## 2021-09-25 MED ORDER — OXYCODONE HCL 5 MG/5ML PO SOLN: 5.0000 mg | Freq: Once | ORAL | Status: DC | PRN

## 2021-09-25 MED ORDER — MORPHINE SULFATE (PF) 4 MG/ML IV SOLN
4.0000 mg | INTRAVENOUS | Status: DC | PRN
  Administered 2021-09-25: 4 mg via INTRAVENOUS
  Filled 2021-09-25: qty 1

## 2021-09-25 MED ORDER — ACETAMINOPHEN 10 MG/ML IV SOLN: 1000.0000 mg | Freq: Once | INTRAVENOUS | Status: DC | PRN

## 2021-09-25 MED ORDER — PHENYLEPHRINE 80 MCG/ML (10ML) SYRINGE FOR IV PUSH (FOR BLOOD PRESSURE SUPPORT)
PREFILLED_SYRINGE | INTRAVENOUS | Status: AC
Start: 2021-09-25 — End: ?
  Filled 2021-09-25: qty 10

## 2021-09-25 MED ORDER — FENTANYL CITRATE (PF) 100 MCG/2ML IJ SOLN
INTRAMUSCULAR | Status: AC
  Administered 2021-09-25: 25 ug via INTRAVENOUS
  Filled 2021-09-25: qty 2

## 2021-09-25 MED ORDER — PROPOFOL 10 MG/ML IV BOLUS
INTRAVENOUS | Status: AC
  Filled 2021-09-25: qty 20

## 2021-09-25 MED ORDER — SUGAMMADEX SODIUM 200 MG/2ML IV SOLN
INTRAVENOUS | Status: DC | PRN
  Administered 2021-09-25: 400 mg via INTRAVENOUS

## 2021-09-25 MED ORDER — ACETAMINOPHEN 10 MG/ML IV SOLN
INTRAVENOUS | Status: AC
  Filled 2021-09-25: qty 100

## 2021-09-25 MED ORDER — ROCURONIUM BROMIDE 100 MG/10ML IV SOLN
INTRAVENOUS | Status: DC | PRN
  Administered 2021-09-25: 20 mg via INTRAVENOUS
  Administered 2021-09-25: 80 mg via INTRAVENOUS

## 2021-09-25 MED ORDER — FENTANYL CITRATE (PF) 100 MCG/2ML IJ SOLN
INTRAMUSCULAR | Status: AC
  Filled 2021-09-25: qty 2

## 2021-09-25 MED ORDER — FENTANYL CITRATE (PF) 100 MCG/2ML IJ SOLN
INTRAMUSCULAR | Status: AC
  Administered 2021-09-25: 50 ug via INTRAVENOUS
  Filled 2021-09-25: qty 2

## 2021-09-25 MED ORDER — MORPHINE SULFATE (PF) 4 MG/ML IV SOLN
4.0000 mg | Freq: Once | INTRAVENOUS | Status: DC
Start: 2021-09-25 — End: 2021-09-25

## 2021-09-25 MED ORDER — PROPOFOL 10 MG/ML IV BOLUS
INTRAVENOUS | Status: DC | PRN
  Administered 2021-09-25: 200 mg via INTRAVENOUS

## 2021-09-25 MED ORDER — PHENYLEPHRINE HCL (PRESSORS) 10 MG/ML IV SOLN
INTRAVENOUS | Status: DC | PRN
  Administered 2021-09-25: 80 ug via INTRAVENOUS
  Administered 2021-09-25 (×2): 160 ug via INTRAVENOUS

## 2021-09-25 MED ORDER — ONDANSETRON HCL 4 MG/2ML IJ SOLN
4.0000 mg | Freq: Four times a day (QID) | INTRAMUSCULAR | Status: DC | PRN
Start: 2021-09-25 — End: 2021-09-26
  Administered 2021-09-25: 4 mg via INTRAVENOUS
  Filled 2021-09-25: qty 2

## 2021-09-25 MED ORDER — PIPERACILLIN-TAZOBACTAM 3.375 G IVPB
3.3750 g | Freq: Three times a day (TID) | INTRAVENOUS | Status: DC
  Administered 2021-09-25 (×2): 3.375 g via INTRAVENOUS
  Filled 2021-09-25 (×2): qty 50

## 2021-09-25 MED ORDER — LIDOCAINE HCL (CARDIAC) PF 100 MG/5ML IV SOSY
PREFILLED_SYRINGE | INTRAVENOUS | Status: DC | PRN
Start: 2021-09-25 — End: 2021-09-25
  Administered 2021-09-25: 100 mg via INTRAVENOUS

## 2021-09-25 MED ORDER — DROPERIDOL 2.5 MG/ML IJ SOLN: 0.6250 mg | Freq: Once | INTRAMUSCULAR | Status: DC | PRN

## 2021-09-25 MED ORDER — PIPERACILLIN-TAZOBACTAM 3.375 G IVPB 30 MIN: 3.3750 g | Freq: Once | INTRAVENOUS | Status: DC

## 2021-09-25 MED ORDER — KETOROLAC TROMETHAMINE 30 MG/ML IJ SOLN
INTRAMUSCULAR | Status: DC | PRN
  Administered 2021-09-25: 30 mg via INTRAVENOUS

## 2021-09-25 SURGICAL SUPPLY — 67 items
ADH SKN CLS APL DERMABOND .7 (GAUZE/BANDAGES/DRESSINGS) ×1
BAG INFUSER PRESSURE 100CC (MISCELLANEOUS) IMPLANT
BAG RETRIEVAL 10 (BASKET) ×1
BLADE SURG SZ11 CARB STEEL (BLADE) ×2 IMPLANT
CANNULA REDUC XI 12-8 STAPL (CANNULA) ×1
CANNULA REDUCER 12-8 DVNC XI (CANNULA) ×1 IMPLANT
COVER TIP SHEARS 8 DVNC (MISCELLANEOUS) ×1 IMPLANT
COVER TIP SHEARS 8MM DA VINCI (MISCELLANEOUS) ×1
DERMABOND ADVANCED (GAUZE/BANDAGES/DRESSINGS) ×1
DERMABOND ADVANCED .7 DNX12 (GAUZE/BANDAGES/DRESSINGS) ×1 IMPLANT
DRAPE ARM DVNC X/XI (DISPOSABLE) ×4 IMPLANT
DRAPE COLUMN DVNC XI (DISPOSABLE) ×1 IMPLANT
DRAPE DA VINCI XI ARM (DISPOSABLE) ×4
DRAPE DA VINCI XI COLUMN (DISPOSABLE) ×1
ELECT REM PT RETURN 9FT ADLT (ELECTROSURGICAL) ×2
ELECTRODE REM PT RTRN 9FT ADLT (ELECTROSURGICAL) ×1 IMPLANT
GLOVE BIOGEL PI IND STRL 6.5 (GLOVE) ×2 IMPLANT
GLOVE BIOGEL PI INDICATOR 6.5 (GLOVE) ×2
GLOVE SURG SYN 6.5 ES PF (GLOVE) ×4 IMPLANT
GLOVE SURG SYN 6.5 PF PI (GLOVE) ×2 IMPLANT
GOWN STRL REUS W/ TWL LRG LVL3 (GOWN DISPOSABLE) ×3 IMPLANT
GOWN STRL REUS W/TWL LRG LVL3 (GOWN DISPOSABLE) ×6
GRASPER SUT TROCAR 14GX15 (MISCELLANEOUS) IMPLANT
IRRIGATOR SUCT 8 DISP DVNC XI (IRRIGATION / IRRIGATOR) IMPLANT
IRRIGATOR SUCTION 8MM XI DISP (IRRIGATION / IRRIGATOR)
IV NS 1000ML (IV SOLUTION)
IV NS 1000ML BAXH (IV SOLUTION) IMPLANT
KIT PINK PAD W/HEAD ARE REST (MISCELLANEOUS) ×2 IMPLANT
KIT PINK PAD W/HEAD ARM REST (MISCELLANEOUS) ×1 IMPLANT
LABEL OR SOLS (LABEL) IMPLANT
MANIFOLD NEPTUNE II (INSTRUMENTS) ×2 IMPLANT
NDL INSUFFLATION 14GA 120MM (NEEDLE) ×1 IMPLANT
NEEDLE HYPO 22GX1.5 SAFETY (NEEDLE) ×2 IMPLANT
NEEDLE INSUFFLATION 14GA 120MM (NEEDLE) ×2 IMPLANT
OBTURATOR OPTICAL STANDARD 8MM (TROCAR) ×1
OBTURATOR OPTICAL STND 8 DVNC (TROCAR) ×1
OBTURATOR OPTICALSTD 8 DVNC (TROCAR) ×1 IMPLANT
PACK LAP CHOLECYSTECTOMY (MISCELLANEOUS) ×2 IMPLANT
RELOAD STAPLE 45 2.5 WHT DVNC (STAPLE) IMPLANT
RELOAD STAPLE 45 3.5 BLU DVNC (STAPLE) ×1 IMPLANT
RELOAD STAPLER 2.5X45 WHT DVNC (STAPLE) IMPLANT
RELOAD STAPLER 3.5X45 BLU DVNC (STAPLE) IMPLANT
SEAL CANN UNIV 5-8 DVNC XI (MISCELLANEOUS) ×3 IMPLANT
SEAL XI 5MM-8MM UNIVERSAL (MISCELLANEOUS) ×3
SEALER VESSEL DA VINCI XI (MISCELLANEOUS)
SEALER VESSEL EXT DVNC XI (MISCELLANEOUS) IMPLANT
SET TUBE SMOKE EVAC HIGH FLOW (TUBING) ×2 IMPLANT
SOLUTION ELECTROLUBE (MISCELLANEOUS) ×2 IMPLANT
SPONGE T-LAP 4X18 ~~LOC~~+RFID (SPONGE) ×2 IMPLANT
STAPLER 45 DA VINCI SURE FORM (STAPLE)
STAPLER 45 SUREFORM DVNC (STAPLE) ×1 IMPLANT
STAPLER CANNULA SEAL DVNC XI (STAPLE) ×1 IMPLANT
STAPLER CANNULA SEAL XI (STAPLE) ×1
STAPLER RELOAD 2.5X45 WHITE (STAPLE)
STAPLER RELOAD 2.5X45 WHT DVNC (STAPLE)
STAPLER RELOAD 3.5X45 BLU DVNC (STAPLE)
STAPLER RELOAD 3.5X45 BLUE (STAPLE)
SUT MNCRL AB 4-0 PS2 18 (SUTURE) ×2 IMPLANT
SUT VIC AB 2-0 SH 27 (SUTURE) ×2
SUT VIC AB 2-0 SH 27XBRD (SUTURE) ×1 IMPLANT
SUT VICRYL 0 AB UR-6 (SUTURE) ×2 IMPLANT
SUT VLOC 90 6 CV-15 VIOLET (SUTURE) ×2 IMPLANT
SYR 30ML LL (SYRINGE) ×2 IMPLANT
SYS BAG RETRIEVAL 10MM (BASKET) ×1
SYSTEM BAG RETRIEVAL 10MM (BASKET) ×1 IMPLANT
TRAY FOLEY MTR SLVR 16FR STAT (SET/KITS/TRAYS/PACK) ×2 IMPLANT
WATER STERILE IRR 500ML POUR (IV SOLUTION) ×2 IMPLANT

## 2021-09-25 NOTE — Discharge Summary (Signed)
? ?  Patient ID: ?Alfred Cortez ?MRN: [id number] ?DOB/AGE: 01-03-1966 56 y.o. ? ?Admit date: 09/25/2021 ?Discharge date: 09/25/2021 ? ? ?Discharge Diagnoses:  ?Principal Problem: ?  Acute appendicitis ? ? ?Procedures: Robotic assisted laparoscopic appendectomy ? ?Hospital Course: Patient admitted with acute appendicitis.  He underwent robotic assisted laparoscopic appendectomy.  He tolerated the procedure well.  Patient with pain control.  Patient is tolerating diet.  Wounds are dry and clean.  Patient ambulating. ? ?Physical Exam ?Vitals reviewed.  ?Constitutional:   ?   Appearance: He is well-developed.  ?HENT:  ?   Head: Normocephalic.  ?Cardiovascular:  ?   Rate and Rhythm: Normal rate and regular rhythm.  ?   Heart sounds: Normal heart sounds.  ?Pulmonary:  ?   Effort: Pulmonary effort is normal.  ?   Breath sounds: Normal breath sounds.  ?Abdominal:  ?   General: Abdomen is flat. Bowel sounds are normal.  ?   Palpations: Abdomen is soft.  ?Skin: ?   General: Skin is warm.  ?   Capillary Refill: Capillary refill takes less than 2 seconds.  ?Neurological:  ?   Mental Status: He is alert and oriented to person, place, and time.  ? ? ? ?Consults: None ? ?Disposition: Discharge disposition: 01-Home or Self Care ? ? ? ? ? ? ?Discharge Instructions   ? ? Diet - low sodium heart healthy   Complete by: As directed ?  ? Increase activity slowly   Complete by: As directed ?  ? Lifting restrictions   Complete by: As directed ?  ? No lifting more than 20 pounds for one week.  ? ?  ? ?Allergies as of 09/25/2021   ?No Known Allergies ?  ? ?  ?Medication List  ?  ? ?TAKE these medications   ? ?Dulaglutide 3 MG/0.5ML Sopn ?Inject 3 mg into the skin every 7 (seven) days. ?  ?glipiZIDE 10 MG tablet ?Commonly known as: GLUCOTROL ?Take 10 mg by mouth daily. ?  ?HYDROcodone-acetaminophen 5-325 MG tablet ?Commonly known as: Norco ?Take 1 tablet by mouth every 4 (four) hours as needed for up to 3 days for moderate pain. ?  ?Jardiance 25 MG  Tabs tablet ?Generic drug: empagliflozin ?  ?lisinopril 20 MG tablet ?Commonly known as: ZESTRIL ?  ?lisinopril 40 MG tablet ?Commonly known as: ZESTRIL ?Take 40 mg by mouth daily. ?  ?meloxicam 15 MG tablet ?Commonly known as: MOBIC ?Take 1 tablet (15 mg total) by mouth daily as needed. ?  ?metFORMIN 1000 MG tablet ?Commonly known as: GLUCOPHAGE ?Take 1,000 mg by mouth 2 (two) times daily with a meal. ?  ?naltrexone 50 MG tablet ?Commonly known as: DEPADE ?Take 50 mg by mouth daily. ?  ?pantoprazole 40 MG tablet ?Commonly known as: Protonix ?Take 1 tablet (40 mg total) by mouth daily. ?  ?rosuvastatin 20 MG tablet ?Commonly known as: CRESTOR ?Take 20 mg by mouth at bedtime. ?  ? ?  ? ? Follow-up Information   ? ? Carolan Shiver, MD Follow up in 2 week(s).   ?Specialty: General Surgery ?Why: Follow up after appendectomy ?Contact information: ?[address] ?[place] [address] ?[phone] ? ? ?  ?  ? ?  ?  ? ?  ? ? ?

## 2021-09-25 NOTE — Discharge Instructions (Signed)
?  Diet: Resume home heart healthy regular diet.  ? ?Activity: No heavy lifting >20 pounds (children, pets, laundry, garbage) or strenuous activity for one week, but light activity and walking are encouraged. Do not drive or drink alcohol if taking narcotic pain medications. ? ?May not return to work until 10/03/21 to recover from surgery. ? ?Wound care: May shower with soapy water and pat dry (do not rub incisions), but no baths or submerging incision underwater until follow-up. (no swimming)  ? ?Medications: Resume all home medications. For mild to moderate pain: acetaminophen (Tylenol) or ibuprofen (if no kidney disease). Combining Tylenol with alcohol can substantially increase your risk of causing liver disease. Narcotic pain medications, if prescribed, can be used for severe pain, though may cause nausea, constipation, and drowsiness. Do not combine Tylenol and Norco within a 6 hour period as Norco contains Tylenol. If you do not need the narcotic pain medication, you do not need to fill the prescription. ? ?Call office [phone]) at any time if any questions, worsening pain, fevers/chills, bleeding, drainage from incision site, or other concerns. ? ?

## 2021-09-25 NOTE — H&P (Signed)
SURGICAL CONSULTATION NOTE  ? ?HISTORY OF PRESENT ILLNESS (HPI):  ?56 y.o. male [facility] ED for evaluation of abdominal pain. Patient reports started having abdominal pain since yesterday.  Pain localized to the right side of the abdomen.  Then radiated to the right lower quadrant.  Pain aggravated by applying pressure.  There is no alleviating factors.  Patient denies nausea or vomiting.  Patient had mild low-grade fever. ? ?At the ED he was found with temp of 100.2.  Tender to palpation in the right lower quadrant.  He was found with leukocytosis.  CT scan of the abdominal pelvis shows fat stranding surrounding the appendix with appendix wall thickening.  No free air or free fluid.  I personally evaluated the images.  I personally evaluated the last. ? ?Surgery is consulted by Dr. Elesa MassedWard in this context for evaluation and management of acute appendicitis. ? ?PAST MEDICAL HISTORY (PMH):  ?Past Medical History:  ?Diagnosis Date  ? Diabetes mellitus without complication (HCC)   ?  ? ?PAST SURGICAL HISTORY (PSH):  ?Past Surgical History:  ?Procedure Laterality Date  ? HERNIA REPAIR    ? WISDOM TOOTH EXTRACTION  1998  ?  ? ?MEDICATIONS:  ?Prior to Admission medications   ?Medication Sig Start Date End Date Taking? Authorizing Provider  ?Dulaglutide 3 MG/0.5ML SOPN Inject 3 mg into the skin every 7 (seven) days. 08/18/20  Yes [provider]  ?glipiZIDE (GLUCOTROL) 10 MG tablet Take 10 mg by mouth daily. 07/21/21  Yes [provider]  ?JARDIANCE 25 MG TABS tablet  04/22/18  Yes [provider]  ?lisinopril (ZESTRIL) 40 MG tablet Take 40 mg by mouth daily. 08/01/21  Yes [provider]  ?metFORMIN (GLUCOPHAGE) 1000 MG tablet Take 1,000 mg by mouth 2 (two) times daily with a meal. 01/18/18  Yes [provider]  ?rosuvastatin (CRESTOR) 20 MG tablet Take 20 mg by mouth at bedtime. 08/25/21  Yes [provider]  ?lisinopril (PRINIVIL,ZESTRIL) 20 MG tablet  04/10/18    [provider]  ?meloxicam (MOBIC) 15 MG tablet Take 1 tablet (15 mg total) by mouth daily as needed. ?Patient not taking: Reported on 09/25/2021 04/22/18   Tommie Samsook, Jayce G, DO  ?naltrexone (DEPADE) 50 MG tablet Take 50 mg by mouth daily. 09/22/21   [provider]  ?pantoprazole (PROTONIX) 40 MG tablet Take 1 tablet (40 mg total) by mouth daily. 05/06/20 06/05/20  Gilles ChiquitoSmith, Zachary P, MD  ?  ? ?ALLERGIES:  ?No Known Allergies  ? ?SOCIAL HISTORY:  ?Social History  ? ?Socioeconomic History  ? Marital status: Widowed  ?  Spouse name: Not on file  ? Number of children: Not on file  ? Years of education: Not on file  ? Highest education level: Not on file  ?Occupational History  ? Not on file  ?Tobacco Use  ? Smoking status: Never  ? Smokeless tobacco: Never  ?Vaping Use  ? Vaping Use: Never used  ?Substance and Sexual Activity  ? Alcohol use: Yes  ? Drug use: No  ? Sexual activity: Not on file  ?Other Topics Concern  ? Not on file  ?Social History Narrative  ? Not on file  ? ?Social Determinants of Health  ? ?Financial Resource Strain: Not on file  ?Food Insecurity: Not on file  ?Transportation Needs: Not on file  ?Physical Activity: Not on file  ?Stress: Not on file  ?Social Connections: Not on file  ?Intimate Partner Violence: Not on file  ?  ? ? ?  FAMILY HISTORY:  ?No family history on file.  ? ?REVIEW OF SYSTEMS:  ?Constitutional: denies weight loss, fever, chills, or sweats  ?Eyes: denies any other vision changes, history of eye injury  ?ENT: denies sore throat, hearing problems  ?Respiratory: denies shortness of breath, wheezing  ?Cardiovascular: denies chest pain, palpitations  ?Gastrointestinal: positive abdominal pain, denies nausea and vomiting ?Genitourinary: denies burning with urination or urinary frequency ?Musculoskeletal: denies any other joint pains or cramps  ?Skin: denies any other rashes or skin discolorations  ?Neurological: denies any other headache, dizziness, weakness  ?Psychiatric:  denies any other depression, anxiety  ? ?All other review of systems were negative  ? ?VITAL SIGNS:  ?Temp:  [99.8 ?F (37.7 ?C)-100.2 ?F (37.9 ?C)] 99.8 ?F (37.7 ?C) (05/13 2212) ?Pulse Rate:  [72-102] 72 (05/14 0627) ?Resp:  [16-20] 18 (05/14 9024) ?BP: (103-165)/(64-93) 103/64 (05/14 0973) ?SpO2:  [93 %-98 %] 97 % (05/14 0627) ?Weight:  [97.5 kg] 97.5 kg (05/13 2003)       Weight: 97.5 kg    ? ?INTAKE/OUTPUT:  ?This shift: No intake/output data recorded.  ?Last 2 shifts: @IOLAST2SHIFTS @  ? ?PHYSICAL EXAM:  ?Constitutional:  ?-- Normal body habitus  ?-- Awake, alert, and oriented x3  ?Eyes:  ?-- Pupils equally round and reactive to light  ?-- No scleral icterus  ?Ear, nose, and throat:  ?-- No jugular venous distension  ?Pulmonary:  ?-- No crackles  ?-- Equal breath sounds bilaterally ?-- Breathing non-labored at rest ?Cardiovascular:  ?-- S1, S2 present  ?-- No pericardial rubs ?Gastrointestinal:  ?-- Abdomen soft, tender to palpation in the right lower quadrant, non-distended, no guarding or rebound tenderness ?-- No abdominal masses appreciated, pulsatile or otherwise  ?Musculoskeletal and Integumentary:  ?-- Wounds or skin discoloration: None appreciated ?-- Extremities: B/L UE and LE FROM, hands and feet warm, no edema  ?Neurologic:  ?-- Motor function: intact and symmetric ?-- Sensation: intact and symmetric ? ? ?Labs:  ? ?  Latest Ref Rng & Units 09/24/2021  ?  8:09 PM 05/05/2020  ? 11:45 PM 02/04/2020  ?  2:31 PM  ?CBC  ?WBC 4.0 - 10.5 K/uL 14.9   10.1   15.8    ?Hemoglobin 13.0 - 17.0 g/dL 02/06/2020   53.2   99.2    ?Hematocrit 39.0 - 52.0 % 44.6   43.7   43.0    ?Platelets 150 - 400 K/uL 311   321   332    ? ? ?  Latest Ref Rng & Units 09/24/2021  ?  8:09 PM 05/05/2020  ? 11:45 PM 02/04/2020  ?  2:31 PM  ?CMP  ?Glucose 70 - 99 mg/dL 02/06/2020   834   196    ?BUN 6 - 20 mg/dL 26   29   20     ?Creatinine 0.61 - 1.24 mg/dL 222     9.79    ?Sodium 135 - 145 mmol/L 134   136   138    ?Potassium 3.5 - 5.1 mmol/L 4.2    4.3   4.6    ?Chloride 98 - 111 mmol/L 102   100   102    ?CO2 22 - 32 mmol/L 22   24   21     ?Calcium 8.9 - 10.3 mg/dL 9.2   9.4   9.1    ?Total Protein 6.5 - 8.1 g/dL 7.5      ?Total Bilirubin 0.3 - 1.2 mg/dL 0.7      ?Alkaline Phos  38 - 126 U/L 87      ?AST 15 - 41 U/L 19      ?ALT 0 - 44 U/L 25      ? ? ?Imaging studies:  ?EXAM: ?CT ABDOMEN AND PELVIS WITH CONTRAST ?  ?TECHNIQUE: ?Multidetector CT imaging of the abdomen and pelvis was performed ?using the standard protocol following bolus administration of ?intravenous contrast. ?  ?RADIATION DOSE REDUCTION: This exam was performed according to the ?departmental dose-optimization program which includes automated ?exposure control, adjustment of the mA and/or kV according to ?patient size and/or use of iterative reconstruction technique. ?  ?CONTRAST:  OMNIPAQUE IOHEXOL 300 MG/ML  SOLN ?  ?COMPARISON:  None Available. ?  ?FINDINGS: ?Lower chest: No acute abnormality. ?  ?Hepatobiliary: No focal liver abnormality is seen. No gallstones, ?gallbladder wall thickening, or biliary dilatation. ?  ?Pancreas: Unremarkable. No pancreatic ductal dilatation or ?surrounding inflammatory changes. ?  ?Spleen: Normal in size without focal abnormality. ?  ?Adrenals/Urinary Tract: Adrenal glands are unremarkable. Kidneys are ?normal in size, without renal calculi or hydronephrosis. A 1.0 cm ?diameter simple cyst is seen along the lateral aspect of the mid ?right kidney. No additional follow-up or imaging is recommended. ?Bladder is unremarkable. ?  ?Stomach/Bowel: Stomach is within normal limits. The appendix is ?dilated and moderately inflamed. No associated perforation or ?abscess is noted. No evidence of bowel dilatation. ?  ?Vascular/Lymphatic: Aortic atherosclerosis. No enlarged abdominal or ?pelvic lymph nodes. ?  ?Reproductive: Prostate is unremarkable. ?  ?Other: A 2.8 cm x 2.0 cm fat containing left inguinal hernia is ?seen. No abdominopelvic ascites. ?   ?Musculoskeletal: No acute or significant osseous findings. ?  ?IMPRESSION: ?Acute appendicitis without evidence of perforation or abscess. ?  ?Aortic Atherosclerosis (ICD10-I70.0). ?  ?  ?Electronically Signed ?  By:

## 2021-09-25 NOTE — Op Note (Signed)
Pre-op Diagnosis: Acute appendicitis  ? ?Post op Diagnosis: Acute appenditicis ? ?Procedure: Robotic assisted laparoscopic appendectomy. ? ?Anesthesia: GETA ? ?Surgeon: Carolan Shiver, MD, FACS ? ?Wound Classification: clean contaminated ? ?Specimen: Appendix ? ?Complications: None ? ?Estimated Blood Loss: 3 mL ? ? ?Indications: Patient is a 56 y.o. male  presented with above right lower quadrant pain. CT scan shows acute appendicitis.    ? ?FIndings: ?1.  Suppurative appendicitis ?2. No peri-appendiceal abscess or phlegmon ?3. Normal anatomy ?4. Adequate hemostasis.  ? ?Description of procedure: The patient was placed on the operating table in the supine position. General anesthesia was induced. A time-out was completed verifying correct patient, procedure, site, positioning, and implant(s) and/or special equipment prior to beginning this procedure. The abdomen was prepped and draped in the usual sterile fashion.  ? ?Palmer's point located and Veress needle was inserted.  After confirming 2 clicks and a positive saline drop test, gas insufflation was initiated until the abdominal pressure was measured at 15 mmHg.  Afterwards, the Veress needle was removed and a 8 mm port was placed in left upper quadrant area using Optiview technique.  After local was infused, 3 additional incision on the left abdominal wall were made 5 cm apart.  An 12 mm port and two other 8 mm ports were placed under direct visualization.  No injuries from trocar placements were noted.  The table was placed in the Trendelenburg position with the right side elevated.  With the use of Tip up grasper, Force Bipolar and Vessel sealer, an inflamed appendix was identified and elevated.  Window created at base of appendix in the mesentery.   ? ?The mesoappendix was divided with bipolar energy and monopolar scissors.  The base of the appendix was ligated with a pursestring of 3-0 V-Loc.  The appendix was divided with scissors.  A second  pursestring was found to imbricate the appendiceal stump. ? ?The appendiceal stump was examined and hemostasis noted. No other pathology was identified within pelvis. The 12 mm trocar removed and port site closed with PMI using 0 vicryl under direct vision. Remaining trocars were removed under direct vision. No bleeding was noted.The abdomen was allowed to collapse.  All skin incisions then closed with subcuticular sutures Monocryl 4-0.  Wounds then dressed with dermabond. ? ?The patient tolerated the procedure well, awakened from anesthesia and was taken to the postanesthesia care unit in satisfactory condition.  Sponge count and instrument count correct at the end of the procedure. ? ?

## 2021-09-25 NOTE — Transfer of Care (Signed)
Immediate Anesthesia Transfer of Care Note ? ?Patient: Alfred Cortez ? ?Procedure(s) Performed: XI ROBOTIC LAPAROSCOPIC ASSISTED APPENDECTOMY (Abdomen) ? ?Patient Location: PACU ? ?Anesthesia Type:General ? ?Level of Consciousness: awake and patient cooperative ? ?Airway & Oxygen Therapy: Patient Spontanous Breathing and Patient connected to nasal cannula oxygen ? ?Post-op Assessment: Report given to RN, Post -op Vital signs reviewed and stable and Patient moving all extremities X 4 ? ?Post vital signs: Reviewed and stable ? ?Last Vitals:  ?Vitals Value Taken Time  ?BP 140/67 09/25/21 1245  ?Temp    ?Pulse 105 09/25/21 1249  ?Resp 14 09/25/21 1249  ?SpO2 100 % 09/25/21 1249  ?Vitals shown include unvalidated device data. ? ?Last Pain:  ?Vitals:  ? 09/25/21 1135  ?TempSrc: Oral  ?PainSc:   ?   ? ?  ? ?Complications: No notable events documented. ?

## 2021-09-25 NOTE — Anesthesia Postprocedure Evaluation (Signed)
Anesthesia Post Note ? ?Patient: Alfred Cortez ? ?Procedure(s) Performed: XI ROBOTIC LAPAROSCOPIC ASSISTED APPENDECTOMY (Abdomen) ? ?Patient location during evaluation: PACU ?Anesthesia Type: General ?Level of consciousness: awake and alert ?Pain management: pain level controlled ?Vital Signs Assessment: post-procedure vital signs reviewed and stable ?Respiratory status: spontaneous breathing, nonlabored ventilation and respiratory function stable ?Cardiovascular status: blood pressure returned to baseline and stable ?Postop Assessment: no apparent nausea or vomiting ?Anesthetic complications: no ? ? ?No notable events documented. ? ? ?Last Vitals:  ?Vitals:  ? 09/25/21 1330 09/25/21 1353  ?BP: 111/69 111/73  ?Pulse: 91 90  ?Resp: 19 18  ?Temp:  36.6 ?C  ?SpO2: 99% 97%  ?  ?Last Pain:  ?Vitals:  ? 09/25/21 1353  ?TempSrc: Oral  ?PainSc:   ? ? ?  ?  ?  ?  ?  ?  ? ?Foye Deer ? ? ? ? ?

## 2021-09-25 NOTE — ED Provider Notes (Signed)
? ?[facility] ?Provider Note ? ? ? Event Date/Time  ? First MD Initiated Contact with Patient 09/25/21 0058   ?  (approximate) ? ? ?History  ? ?Abdominal Pain ? ? ?HPI ? ?Alfred Cortez is a 56 y.o. male with history of non-insulin-dependent diabetes who presents to the emergency department with complaints of right lower quadrant abdominal pain, fevers and nausea that started today.  Has had previous hernia repair when he was 56 years old.  Denies vomiting, diarrhea, dysuria, hematuria.  He is worried that he has appendicitis. ? ? ?History provided by patient. ? ? ? ?Past Medical History:  ?Diagnosis Date  ? Diabetes mellitus without complication (HCC)   ? ? ?Past Surgical History:  ?Procedure Laterality Date  ? HERNIA REPAIR    ? WISDOM TOOTH EXTRACTION  1998  ? ? ?MEDICATIONS:  ?Prior to Admission medications   ?Medication Sig Start Date End Date Taking? Authorizing Provider  ?JARDIANCE 25 MG TABS tablet  04/22/18   [provider]  ?lisinopril (PRINIVIL,ZESTRIL) 20 MG tablet  04/10/18   [provider]  ?meloxicam (MOBIC) 15 MG tablet Take 1 tablet (15 mg total) by mouth daily as needed. 04/22/18   Tommie Sams, DO  ?metFORMIN (GLUCOPHAGE) 1000 MG tablet Take by mouth. 01/18/18   [provider]  ?pantoprazole (PROTONIX) 40 MG tablet Take 1 tablet (40 mg total) by mouth daily. 05/06/20 06/05/20  Gilles Chiquito, MD  ?TRULICITY 0.75 MG/0.5ML SOPN INJECT 0.75 MG SUBCUTANEOUSLY Q 7 DAYS 04/10/18   [provider]  ? ? ?Physical Exam  ? ?Triage Vital Signs: ?ED Triage Vitals  ?Enc Vitals Group  ?   BP 09/24/21 1956 (!) 165/93  ?   Pulse Rate 09/24/21 1956 (!) 101  ?   Resp 09/24/21 1956 20  ?   Temp 09/24/21 1956 100.2 ?F (37.9 ?C)  ?   Temp Source 09/24/21 1956 Oral  ?   SpO2 09/24/21 1956 96 %  ?   Weight 09/24/21 2003 215 lb (97.5 kg)  ?   Height --   ?   Head Circumference --   ?   Peak Flow --   ?   Pain Score 09/24/21 2002 8  ?   Pain Loc --   ?   Pain Edu?  --   ?   Excl. in GC? --   ? ? ?Most recent vital signs: ?Vitals:  ? 09/24/21 2212 09/25/21 0037  ?BP: (!) 141/88 125/80  ?Pulse: 96 (!) 102  ?Resp: 16 17  ?Temp: 99.8 ?F (37.7 ?C)   ?SpO2: 98% 98%  ? ? ?CONSTITUTIONAL: Alert and oriented and responds appropriately to questions. Well-appearing; well-nourished ?HEAD: Normocephalic, atraumatic ?EYES: Conjunctivae clear, pupils appear equal, sclera nonicteric ?ENT: normal nose; moist mucous membranes ?NECK: Supple, normal ROM ?CARD: RRR; S1 and S2 appreciated; no murmurs, no clicks, no rubs, no gallops ?RESP: Normal chest excursion without splinting or tachypnea; breath sounds clear and equal bilaterally; no wheezes, no rhonchi, no rales, no hypoxia or respiratory distress, speaking full sentences ?ABD/GI: Normal bowel sounds; non-distended; soft, tender in the right lower quadrant with minimal guarding, no rebound ?BACK: The back appears normal ?EXT: Normal ROM in all joints; no deformity noted, no edema; no cyanosis ?SKIN: Normal color for age and race; warm; no rash on exposed skin ?NEURO: Moves all extremities equally, normal speech ?PSYCH: The patient's mood and manner are appropriate. ? ? ?ED Results / Procedures / Treatments  ? ?LABS: ?(all  labs ordered are listed, but only abnormal results are displayed) ?Labs Reviewed  ?COMPREHENSIVE METABOLIC PANEL - Abnormal; Notable for the following components:  ?    Result Value  ? Sodium 134 (*)   ? Glucose, Bld 135 (*)   ? BUN 26 (*)   ? All other components within normal limits  ?CBC - Abnormal; Notable for the following components:  ? WBC 14.9 (*)   ? All other components within normal limits  ?URINALYSIS, ROUTINE W REFLEX MICROSCOPIC - Abnormal; Notable for the following components:  ? Color, Urine YELLOW (*)   ? APPearance CLEAR (*)   ? Glucose, UA >=500 (*)   ? All other components within normal limits  ?LIPASE, BLOOD  ? ? ? ?EKG: ? ? ?RADIOLOGY: ?My personal review and interpretation of imaging: CT scan shows  acute appendicitis. ? ?I have personally reviewed all radiology reports.   ?CT ABDOMEN PELVIS W CONTRAST ? ?Result Date: 09/25/2021 ?CLINICAL DATA:  Right lower quadrant abdominal pain. EXAM: CT ABDOMEN AND PELVIS WITH CONTRAST TECHNIQUE: Multidetector CT imaging of the abdomen and pelvis was performed using the standard protocol following bolus administration of intravenous contrast. RADIATION DOSE REDUCTION: This exam was performed according to the departmental dose-optimization program which includes automated exposure control, adjustment of the mA and/or kV according to patient size and/or use of iterative reconstruction technique. CONTRAST:  100mL OMNIPAQUE IOHEXOL 300 MG/ML  SOLN COMPARISON:  None Available. FINDINGS: Lower chest: No acute abnormality. Hepatobiliary: No focal liver abnormality is seen. No gallstones, gallbladder wall thickening, or biliary dilatation. Pancreas: Unremarkable. No pancreatic ductal dilatation or surrounding inflammatory changes. Spleen: Normal in size without focal abnormality. Adrenals/Urinary Tract: Adrenal glands are unremarkable. Kidneys are normal in size, without renal calculi or hydronephrosis. A 1.0 cm diameter simple cyst is seen along the lateral aspect of the mid right kidney. No additional follow-up or imaging is recommended. Bladder is unremarkable. Stomach/Bowel: Stomach is within normal limits. The appendix is dilated and moderately inflamed. No associated perforation or abscess is noted. No evidence of bowel dilatation. Vascular/Lymphatic: Aortic atherosclerosis. No enlarged abdominal or pelvic lymph nodes. Reproductive: Prostate is unremarkable. Other: A 2.8 cm x 2.0 cm fat containing left inguinal hernia is seen. No abdominopelvic ascites. Musculoskeletal: No acute or significant osseous findings. IMPRESSION: Acute appendicitis without evidence of perforation or abscess. Aortic Atherosclerosis (ICD10-I70.0). Electronically Signed   By: Aram Candelahaddeus  Houston M.D.    On: 09/25/2021 00:42   ? ? ?PROCEDURES: ? ?Critical Care performed: No ? ? ? ? ?Procedures ? ? ? ?IMPRESSION / MDM / ASSESSMENT AND PLAN / ED COURSE  ?I reviewed the triage vital signs and the nursing notes. ? ? ? ?Patient here with right lower quadrant pain, fever. ? ? ? ? ?DIFFERENTIAL DIAGNOSIS (includes but not limited to):   Appendicitis, colitis, diverticulitis, bowel obstruction, UTI, kidney stone ? ? ?PLAN: We will obtain CBC, CMP, lipase, urinalysis, CT of the abdomen pelvis.  Will give IV fluids, pain and nausea medicine. ? ? ?MEDICATIONS GIVEN IN ED: ?Medications  ?piperacillin-tazobactam (ZOSYN) IVPB 3.375 g (has no administration in time range)  ?morphine (PF) 4 MG/ML injection 4 mg (has no administration in time range)  ?ondansetron (ZOFRAN) injection 4 mg (has no administration in time range)  ?0.9 %  sodium chloride infusion (has no administration in time range)  ?iohexol (OMNIPAQUE) 300 MG/ML solution 100 mL (100 mLs Intravenous Contrast Given 09/25/21 0009)  ? ? ? ?ED COURSE: Labs show leukocytosis of 14,000.  Normal electrolytes  and renal function.  LFTs and lipase normal.  Urine shows no sign of infection.  CT scan reviewed and interpreted by myself and radiologist and shows acute appendicitis without rupture or abscess.  Will discuss with general surgery.  Will give IV Zosyn.  We will keep n.p.o. and start IV fluids. ? ? ?CONSULTS:  Consulted and discussed patient's case with general surgery, Dr. Maia Plan.  I have recommended admission and consulting physician agrees and will place admission orders.  Patient (and family if present) agree with this plan.  ? ?I reviewed all nursing notes, vitals, pertinent previous records.  All labs, EKGs, imaging ordered have been independently reviewed and interpreted by myself. ? ? ? ?OUTSIDE RECORDS REVIEWED: Reviewed patient's last endocrinology note with Verdis Frederickson on 07/15/2021. ? ? ? ? ? ? ? ? ?FINAL CLINICAL IMPRESSION(S) / ED DIAGNOSES  ? ?Final  diagnoses:  ?Acute appendicitis, unspecified acute appendicitis type  ? ? ? ?Rx / DC Orders  ? ?ED Discharge Orders   ? ? None  ? ?  ? ? ? ?Note:  This document was prepared using Dragon voice recognition softwa

## 2021-09-25 NOTE — Anesthesia Procedure Notes (Signed)
Procedure Name: Intubation ?Date/Time: 09/25/2021 11:44 AM ?Performed by: Loree Fee, CRNA ?Pre-anesthesia Checklist: Patient identified, Patient being monitored, Timeout performed, Emergency Drugs available and Suction available ?Patient Re-evaluated:Patient Re-evaluated prior to induction ?Oxygen Delivery Method: Circle system utilized ?Preoxygenation: Pre-oxygenation with 100% oxygen ?Induction Type: IV induction ?Ventilation: Mask ventilation without difficulty ?Laryngoscope Size: Mac and 4 ?Grade View: Grade I ?Tube type: Oral ?Tube size: 7.5 mm ?Number of attempts: 1 ?Airway Equipment and Method: Stylet ?Placement Confirmation: ETT inserted through vocal cords under direct vision, positive ETCO2 and breath sounds checked- equal and bilateral ?Secured at: 22 cm ?Tube secured with: Tape ?Dental Injury: Teeth and Oropharynx as per pre-operative assessment  ? ? ? ? ?

## 2021-09-25 NOTE — Anesthesia Preprocedure Evaluation (Addendum)
Anesthesia Evaluation  ?Patient identified by MRN, date of birth, ID band ?Patient awake ? ? ? ?Reviewed: ?Allergy & Precautions, NPO status , Patient's Chart, lab work & pertinent test results ? ?Airway ?Mallampati: III ? ?TM Distance: >3 FB ?Neck ROM: full ? ? ? Dental ? ?(+) Missing,  ?  ?Pulmonary ?neg pulmonary ROS,  ?  ?Pulmonary exam normal ? ? ? ? ? ? ? Cardiovascular ?hypertension, Pt. on medications ?Normal cardiovascular exam ? ? ?  ?Neuro/Psych ?negative neurological ROS ? negative psych ROS  ? GI/Hepatic ?Neg liver ROS, GERD  Controlled,  ?Endo/Other  ?diabetes, Well Controlled, Type 2, Oral Hypoglycemic Agents ? Renal/GU ?  ? ?  ?Musculoskeletal ? ? Abdominal ?(+)  ?Abdomen: tender. ? ?  ?Peds ? Hematology ?negative hematology ROS ?(+)   ?Anesthesia Other Findings ?appendicitis ? ?Past Medical History: ?No date: Diabetes mellitus without complication (HCC) ? ?Past Surgical History: ?No date: HERNIA REPAIR ?1998: WISDOM TOOTH EXTRACTION ? ?BMI   ? Body Mass Index: 30.85 kg/m?  ?  ? ? Reproductive/Obstetrics ?negative OB ROS ? ?  ? ? ? ? ? ? ? ? ? ? ? ? ? ?  ?  ? ? ? ? ? ? ?Anesthesia Physical ?Anesthesia Plan ? ?ASA: 2 ? ?Anesthesia Plan: General ETT  ? ?Post-op Pain Management: Ofirmev IV (intra-op)* and Toradol IV (intra-op)*  ? ?Induction: Intravenous ? ?PONV Risk Score and Plan: 3 and Ondansetron, Dexamethasone and Midazolam ? ?Airway Management Planned: Oral ETT ? ?Additional Equipment:  ? ?Intra-op Plan:  ? ?Post-operative Plan: Extubation in OR ? ?Informed Consent: I have reviewed the patients History and Physical, chart, labs and discussed the procedure including the risks, benefits and alternatives for the proposed anesthesia with the patient or authorized representative who has indicated his/her understanding and acceptance.  ? ? ? ?Dental Advisory Given ? ?Plan Discussed with: Anesthesiologist, CRNA and Surgeon ? ?Anesthesia Plan Comments:    ? ? ? ? ? ?Anesthesia Quick Evaluation ? ?

## 2021-09-26 ENCOUNTER — Encounter: Payer: Self-pay | Admitting: General Surgery

## 2021-09-26 LAB — GLUCOSE, CAPILLARY: Glucose-Capillary: 94 mg/dL (ref 70–99)

## 2021-09-27 LAB — SURGICAL PATHOLOGY

## 2021-12-22 ENCOUNTER — Other Ambulatory Visit: Payer: Self-pay

## 2021-12-22 ENCOUNTER — Emergency Department
Admission: EM | Admit: 2021-12-22 | Discharge: 2021-12-23 | Disposition: A | Discharge status: Home / Self Care | Payer: BC Managed Care – PPO | Attending: Emergency Medicine | Admitting: Emergency Medicine

## 2021-12-22 DIAGNOSIS — S91031A Puncture wound without foreign body, right ankle, initial encounter: Secondary | ICD-10-CM | POA: Insufficient documentation

## 2021-12-22 DIAGNOSIS — S99911A Unspecified injury of right ankle, initial encounter: Secondary | ICD-10-CM | POA: Diagnosis present

## 2021-12-22 DIAGNOSIS — W5911XA Bitten by nonvenomous snake, initial encounter: Secondary | ICD-10-CM

## 2021-12-22 DIAGNOSIS — E119 Type 2 diabetes mellitus without complications: Secondary | ICD-10-CM | POA: Diagnosis not present

## 2021-12-22 DIAGNOSIS — Z23 Encounter for immunization: Secondary | ICD-10-CM | POA: Insufficient documentation

## 2021-12-22 HISTORY — DX: Essential (primary) hypertension: I10

## 2021-12-22 LAB — CBC WITH DIFFERENTIAL/PLATELET
Abs Immature Granulocytes: 0.03 10*3/uL (ref 0.00–0.07)
Basophils Absolute: 0.1 10*3/uL (ref 0.0–0.1)
Basophils Relative: 1 %
Eosinophils Absolute: 0.2 10*3/uL (ref 0.0–0.5)
Eosinophils Relative: 3 %
HCT: 42.8 % (ref 39.0–52.0)
Hemoglobin: 14.3 g/dL (ref 13.0–17.0)
Immature Granulocytes: 0 %
Lymphocytes Relative: 31 %
Lymphs Abs: 2.8 10*3/uL (ref 0.7–4.0)
MCH: 29.4 pg (ref 26.0–34.0)
MCHC: 33.4 g/dL (ref 30.0–36.0)
MCV: 88.1 fL (ref 80.0–100.0)
Monocytes Absolute: 0.9 10*3/uL (ref 0.1–1.0)
Monocytes Relative: 10 %
Neutro Abs: 5.1 10*3/uL (ref 1.7–7.7)
Neutrophils Relative %: 55 %
Platelets: 303 10*3/uL (ref 150–400)
RBC: 4.86 MIL/uL (ref 4.22–5.81)
RDW: 12 % (ref 11.5–15.5)
WBC: 9.2 10*3/uL (ref 4.0–10.5)
nRBC: 0 % (ref 0.0–0.2)

## 2021-12-22 LAB — COMPREHENSIVE METABOLIC PANEL
ALT: 33 U/L (ref 0–44)
AST: 27 U/L (ref 15–41)
Albumin: 4.1 g/dL (ref 3.5–5.0)
Alkaline Phosphatase: 103 U/L (ref 38–126)
Anion gap: 10 (ref 5–15)
BUN: 28 mg/dL — ABNORMAL HIGH (ref 6–20)
CO2: 21 mmol/L — ABNORMAL LOW (ref 22–32)
Calcium: 8.9 mg/dL (ref 8.9–10.3)
Chloride: 107 mmol/L (ref 98–111)
Creatinine, Ser: 1.28 mg/dL — ABNORMAL HIGH (ref 0.61–1.24)
GFR, Estimated: >60 mL/min (ref >60)
Glucose, Bld: 184 mg/dL — ABNORMAL HIGH (ref 70–99)
Potassium: 4.4 mmol/L (ref 3.5–5.1)
Sodium: 138 mmol/L (ref 135–145)
Total Bilirubin: 1.2 mg/dL (ref 0.3–1.2)
Total Protein: 6.9 g/dL (ref 6.5–8.1)

## 2021-12-22 LAB — PROTIME-INR
INR: 1 (ref 0.8–1.2)
Prothrombin Time: 12.6 seconds (ref 11.4–15.2)

## 2021-12-22 LAB — APTT: aPTT: 32 seconds (ref 24–36)

## 2021-12-22 LAB — FIBRINOGEN: Fibrinogen: 317 mg/dL (ref 210–475)

## 2021-12-22 MED ORDER — HYDROMORPHONE HCL 1 MG/ML IJ SOLN
1.0000 mg | Freq: Once | INTRAMUSCULAR | Status: AC
  Administered 2021-12-22: 1 mg via INTRAVENOUS
  Filled 2021-12-22: qty 1

## 2021-12-22 MED ORDER — TETANUS-DIPHTH-ACELL PERTUSSIS 5-2.5-18.5 LF-MCG/0.5 IM SUSY
0.5000 mL | PREFILLED_SYRINGE | Freq: Once | INTRAMUSCULAR | Status: AC
  Administered 2021-12-22: 0.5 mL via INTRAMUSCULAR
  Filled 2021-12-22: qty 0.5

## 2021-12-22 MED ORDER — HYDROMORPHONE HCL 1 MG/ML IJ SOLN
0.5000 mg | Freq: Once | INTRAMUSCULAR | Status: AC
  Administered 2021-12-22: 0.5 mg via INTRAVENOUS
  Filled 2021-12-22: qty 0.5

## 2021-12-22 MED ORDER — SODIUM CHLORIDE 0.9 % IV BOLUS
1000.0000 mL | Freq: Once | INTRAVENOUS | Status: AC
Start: 2021-12-22 — End: 2021-12-23
  Administered 2021-12-22: 1000 mL via INTRAVENOUS

## 2021-12-22 NOTE — ED Notes (Addendum)
Foot:  27.5cm Ankle:  25.5cm Calf:  40cm Thigh: 46.5cm

## 2021-12-22 NOTE — ED Notes (Signed)
Measurements: Foot:  26cm Ankle: 24.8cm Calf: 39.5cm Thigh: 46.6cm

## 2021-12-22 NOTE — ED Notes (Signed)
Foot: 26cm Ankle: 25cm Calf: 39cm Thigh: 46.4cm

## 2021-12-22 NOTE — ED Triage Notes (Signed)
Pt to ED via [facility]. Pt was walking outside and was bitten by a copperhead at [employer]. Pt c/o pain and swelling at site, right ankle.  Pt c/o facial tingling and dry mouth.   EMS VS 152/88 Hr=107 98% RA

## 2021-12-22 NOTE — ED Provider Notes (Addendum)
[facility] Provider Note    Event Date/Time   First MD Initiated Contact with Patient 12/22/21 2054     (approximate)   History   Snake Bite   HPI  Alfred Cortez is a 56 y.o. male with diabetes who comes in with concern for snakebite.  Patient reports a snake bite by a large 3 foot copperhead snake where he actually stepped on it and he has fang marks in the right lateral aspect of his ankle.  He has had some increased swelling and pain as a throbbing no weakness, tingling or other symptoms.     Physical Exam   Triage Vital Signs: Blood pressure (!) 158/87, pulse 95, temperature 98.7 F (37.1 C), temperature source Oral, resp. rate 11, height 5\' 10"  (1.778 m), weight 97.1 kg, SpO2 98 %.  Most recent vital signs: Vitals:   12/22/21 2200 12/22/21 2230  BP: (!) 145/82 (!) 158/87  Pulse: 98 95  Resp:  11  Temp:    SpO2: 97% 98%     General: Awake, no distress.  CV:  Good peripheral perfusion.  Resp:  Normal effort.  Abd:  No distention.  Other:  Puncture wounds noted on lateral aspect of the Right ankle with some mild swelling and erythema noted.   ED Results / Procedures / Treatments   Labs (all labs ordered are listed, but only abnormal results are displayed) Labs Reviewed  COMPREHENSIVE METABOLIC PANEL - Abnormal; Notable for the following components:      Result Value   CO2 21 (*)    Glucose, Bld 184 (*)    BUN 28 (*)    Creatinine, Ser 1.28 (*)    All other components within normal limits  CBC WITH DIFFERENTIAL/PLATELET  FIBRINOGEN  PROTIME-INR  APTT     EKG  My interpretation of EKG:  Sinus tachycardia rate 110 without any ST elevation or T wave inversions, intervals  RADIOLOGY   PROCEDURES:  Critical Care performed: No  .1-3 Lead EKG Interpretation  Performed by: 02/21/22, MD Authorized by: Concha Se, MD     Interpretation: normal     ECG rate:  90   ECG rate assessment: normal     Rhythm: sinus  rhythm     Ectopy: none     Conduction: normal   Comments:     Occasional sinus tachycardiac     MEDICATIONS ORDERED IN ED: Medications - No data to display   IMPRESSION / MDM / ASSESSMENT AND PLAN / ED COURSE  I reviewed the triage vital signs and the nursing notes.   Patient's presentation is most consistent with acute presentation with potential threat to life or bodily function.   Concern for snake bite-tdap updated, wound washed out, elevated above the head, given dilaudid IV for pain. Labs to evaluate for coagulopathy.   Ptt normal Fibrinogen normal Coags normal  Cbc normal Cmp elevated cr--- will give fluid    D/w Poison control and will monitor swelling to decide if meets criteria for cro fab.  Pt handed to on coming team pending re-evaluation  Swelling seems stable- pt wants to try to hold off on crofab if possible but still having pain given some more dilaudid.  The patient is on the cardiac monitor to evaluate for evidence of arrhythmia and/or significant heart rate changes.      FINAL CLINICAL IMPRESSION(S) / ED DIAGNOSES   Final diagnoses:  Snake bite, initial encounter     Rx /  DC Orders   ED Discharge Orders     None        Note:  This document was prepared using Dragon voice recognition software and may include unintentional dictation errors.   Concha Se, MD 12/22/21 2250    Concha Se, MD 12/22/21 [phone]

## 2021-12-22 NOTE — ED Provider Notes (Signed)
-----------------------------------------   11:04 PM on 12/22/2021 -----------------------------------------  Assuming care from Dr. Fuller Plan.  In short, Alfred Cortez is a 56 y.o. male with a chief complaint of snake bite.  Refer to the original H&P for additional details.  The current plan of care is to observe, repeat labs, determine dispo.   Clinical Course as of 12/23/21 0419  Fri Dec 23, 2021  0140 Patient has had a's slight increase in swelling, about 1 cm in circumference, but no substantial increase in size.  He is still reporting severe pain.  I am hoping to transition him to oral medication so I ordered Percocet 2 tablets. [CF]  [id number] I reviewed the patient's repeat lab work and it is all reassuring.  Mild leukocytosis, no evidence of coagulopathy.  Creatinine is improved.  I reassessed the patient and the swelling is up to about the mid calf but not extending up to the knee.  Compartments are soft and easily compressible even though of course there is swelling which makes it a little bit firm.  He does still have significant pain but that is to be expected for an envenomation.  He has no systemic signs or symptoms.  He falls into the low risk criteria and does not meet criteria for CroFab.  I talked about all of this with him and he is in agreement and is ready to go home.  I ordered crutches for him so he can minimize weightbearing with his right leg and encouraged close outpatient follow-up.  I gave strict return precautions.  He understands and agrees with the plan. [CF]    Clinical Course User Index [CF] Loleta Rose, MD     Medications  HYDROmorphone (DILAUDID) injection 0.5 mg (0.5 mg Intravenous Given 12/22/21 2058)  HYDROmorphone (DILAUDID) injection 1 mg (1 mg Intravenous Given 12/22/21 2228)  sodium chloride 0.9 % bolus 1,000 mL (0 mLs Intravenous Stopped 12/23/21 0147)  HYDROmorphone (DILAUDID) injection 1 mg (1 mg Intravenous Given 12/22/21 2333)  Tdap (BOOSTRIX) injection  0.5 mL (0.5 mLs Intramuscular Given 12/22/21 2336)  oxyCODONE-acetaminophen (PERCOCET/ROXICET) 5-325 MG per tablet 2 tablet (2 tablets Oral Given 12/23/21 0148)     ED Discharge Orders          Ordered    oxyCODONE-acetaminophen (PERCOCET) 5-325 MG tablet  Every 8 hours PRN        12/23/21 0419    docusate sodium (COLACE) 100 MG capsule        12/23/21 0419           Final diagnoses:  Snake bite, initial encounter     Loleta Rose, MD 12/23/21 [phone]

## 2021-12-23 LAB — APTT: aPTT: 31 seconds (ref 24–36)

## 2021-12-23 LAB — COMPREHENSIVE METABOLIC PANEL
ALT: 29 U/L (ref 0–44)
AST: 20 U/L (ref 15–41)
Albumin: 3.7 g/dL (ref 3.5–5.0)
Alkaline Phosphatase: 84 U/L (ref 38–126)
Anion gap: 7 (ref 5–15)
BUN: 27 mg/dL — ABNORMAL HIGH (ref 6–20)
CO2: 20 mmol/L — ABNORMAL LOW (ref 22–32)
Calcium: 8.7 mg/dL — ABNORMAL LOW (ref 8.9–10.3)
Chloride: 112 mmol/L — ABNORMAL HIGH (ref 98–111)
Creatinine, Ser: 1.09 mg/dL (ref 0.61–1.24)
GFR, Estimated: >60 mL/min (ref >60)
Glucose, Bld: 187 mg/dL — ABNORMAL HIGH (ref 70–99)
Potassium: 4.3 mmol/L (ref 3.5–5.1)
Sodium: 139 mmol/L (ref 135–145)
Total Bilirubin: 0.8 mg/dL (ref 0.3–1.2)
Total Protein: 6 g/dL — ABNORMAL LOW (ref 6.5–8.1)

## 2021-12-23 LAB — CBC WITH DIFFERENTIAL/PLATELET
Abs Immature Granulocytes: 0.02 10*3/uL (ref 0.00–0.07)
Basophils Absolute: 0.1 10*3/uL (ref 0.0–0.1)
Basophils Relative: 1 %
Eosinophils Absolute: 0.1 10*3/uL (ref 0.0–0.5)
Eosinophils Relative: 1 %
HCT: 42.5 % (ref 39.0–52.0)
Hemoglobin: 13.9 g/dL (ref 13.0–17.0)
Immature Granulocytes: 0 %
Lymphocytes Relative: 9 %
Lymphs Abs: 1.1 10*3/uL (ref 0.7–4.0)
MCH: 29.6 pg (ref 26.0–34.0)
MCHC: 32.7 g/dL (ref 30.0–36.0)
MCV: 90.4 fL (ref 80.0–100.0)
Monocytes Absolute: 0.8 10*3/uL (ref 0.1–1.0)
Monocytes Relative: 6 %
Neutro Abs: 10.2 10*3/uL — ABNORMAL HIGH (ref 1.7–7.7)
Neutrophils Relative %: 83 %
Platelets: 255 10*3/uL (ref 150–400)
RBC: 4.7 MIL/uL (ref 4.22–5.81)
RDW: 12 % (ref 11.5–15.5)
WBC: 12.3 10*3/uL — ABNORMAL HIGH (ref 4.0–10.5)
nRBC: 0 % (ref 0.0–0.2)

## 2021-12-23 LAB — PROTIME-INR
INR: 1 (ref 0.8–1.2)
Prothrombin Time: 12.8 seconds (ref 11.4–15.2)

## 2021-12-23 LAB — FIBRINOGEN: Fibrinogen: 319 mg/dL (ref 210–475)

## 2021-12-23 MED ORDER — DOCUSATE SODIUM 100 MG PO CAPS: ORAL_CAPSULE | ORAL | 0 refills | Status: AC

## 2021-12-23 MED ORDER — OXYCODONE-ACETAMINOPHEN 5-325 MG PO TABS: 2.0000 | ORAL_TABLET | Freq: Three times a day (TID) | ORAL | 0 refills | Status: DC | PRN

## 2021-12-23 MED ORDER — OXYCODONE-ACETAMINOPHEN 5-325 MG PO TABS
2.0000 | ORAL_TABLET | Freq: Once | ORAL | Status: AC
  Administered 2021-12-23: 2 via ORAL
  Filled 2021-12-23: qty 2

## 2021-12-23 NOTE — ED Notes (Signed)
Foot:  27.5 Ankle:  25.5 Calf:  40.5 Thigh:  46.5

## 2021-12-23 NOTE — ED Notes (Signed)
Foot: 27.5cm Ankle: 25.5cm Calf: 40.2cm Thigh: 47cm

## 2021-12-23 NOTE — Discharge Instructions (Addendum)
Please read through the included information about snake bite care.  Please do not wrap your foot/leg, and do not use ice.  You can keep it elevated and minimize weightbearing.  Use the crutches when you need to get around.  You can take over-the-counter pain medication according to label instructions. Take Percocet as prescribed for severe pain. Do not drink alcohol, drive or participate in any other potentially dangerous activities while taking this medication as it may make you sleepy. Do not take this medication with any other sedating medications, either prescription or over-the-counter. If you were prescribed Percocet or Vicodin, do not take these with acetaminophen (Tylenol) as it is already contained within these medications.   This medication is an opiate (or narcotic) pain medication and can be habit forming.  Use it as little as possible to achieve adequate pain control.  Do not use or use it with extreme caution if you have a history of opiate abuse or dependence.  If you are on a pain contract with your primary care doctor or a pain specialist, be sure to let them know you were prescribed this medication today from the [facility] Emergency Department.  This medication is intended for your use only - do not give any to anyone else and keep it in a secure place where nobody else, especially children, have access to it.  It will also cause or worsen constipation, so you may want to consider taking an over-the-counter stool softener while you are taking this medication.    Return to the emergency department if you develop new or worsening symptoms that concern you.

## 2022-11-09 ENCOUNTER — Encounter: Payer: Self-pay | Admitting: Emergency Medicine

## 2022-11-09 ENCOUNTER — Other Ambulatory Visit: Payer: Self-pay

## 2022-11-09 ENCOUNTER — Emergency Department: Payer: BC Managed Care – PPO

## 2022-11-09 DIAGNOSIS — E119 Type 2 diabetes mellitus without complications: Secondary | ICD-10-CM | POA: Insufficient documentation

## 2022-11-09 DIAGNOSIS — I10 Essential (primary) hypertension: Secondary | ICD-10-CM | POA: Diagnosis not present

## 2022-11-09 DIAGNOSIS — Z794 Long term (current) use of insulin: Secondary | ICD-10-CM | POA: Diagnosis not present

## 2022-11-09 DIAGNOSIS — M7989 Other specified soft tissue disorders: Secondary | ICD-10-CM | POA: Diagnosis not present

## 2022-11-09 DIAGNOSIS — W228XXA Striking against or struck by other objects, initial encounter: Secondary | ICD-10-CM | POA: Insufficient documentation

## 2022-11-09 DIAGNOSIS — S92512A Displaced fracture of proximal phalanx of left lesser toe(s), initial encounter for closed fracture: Secondary | ICD-10-CM | POA: Insufficient documentation

## 2022-11-09 DIAGNOSIS — Z7984 Long term (current) use of oral hypoglycemic drugs: Secondary | ICD-10-CM | POA: Diagnosis not present

## 2022-11-09 DIAGNOSIS — S99922A Unspecified injury of left foot, initial encounter: Secondary | ICD-10-CM | POA: Diagnosis present

## 2022-11-09 DIAGNOSIS — Z79899 Other long term (current) drug therapy: Secondary | ICD-10-CM | POA: Diagnosis not present

## 2022-11-09 NOTE — ED Triage Notes (Signed)
Patient c/o left 5th toe pain after accidentally kicking a suitcase while going to bed.  Patient did not fall.

## 2022-11-10 ENCOUNTER — Emergency Department
Admission: EM | Admit: 2022-11-10 | Discharge: 2022-11-10 | Disposition: A | Discharge status: Home / Self Care | Payer: BC Managed Care – PPO | Attending: Emergency Medicine | Admitting: Emergency Medicine

## 2022-11-10 DIAGNOSIS — S92512A Displaced fracture of proximal phalanx of left lesser toe(s), initial encounter for closed fracture: Secondary | ICD-10-CM

## 2022-11-10 MED ORDER — IBUPROFEN 800 MG PO TABS: 800.0000 mg | ORAL_TABLET | Freq: Three times a day (TID) | ORAL | 0 refills | Status: AC | PRN

## 2022-11-10 MED ORDER — OXYCODONE-ACETAMINOPHEN 5-325 MG PO TABS: 1.0000 | ORAL_TABLET | ORAL | 0 refills | Status: AC | PRN

## 2022-11-10 MED ORDER — OXYCODONE-ACETAMINOPHEN 5-325 MG PO TABS
1.0000 | ORAL_TABLET | Freq: Once | ORAL | Status: AC
  Administered 2022-11-10: 1 via ORAL
  Filled 2022-11-10: qty 1

## 2022-11-10 MED ORDER — IBUPROFEN 800 MG PO TABS
800.0000 mg | ORAL_TABLET | Freq: Once | ORAL | Status: AC
  Administered 2022-11-10: 800 mg via ORAL
  Filled 2022-11-10: qty 1

## 2022-11-10 NOTE — Discharge Instructions (Addendum)
You may take pain medicines as needed (Motrin/Percocet #15).  Buddy tape your left fifth and fourth toes together.  Wear podiatric shoe while awake.  Return to the ER for worsening symptoms, increased swelling/pain, or other concerns.

## 2022-11-10 NOTE — ED Provider Notes (Signed)
[facility] Provider Note    Event Date/Time   First MD Initiated Contact with Patient 11/10/22 0209     (approximate)   History   Toe Pain   HPI  Alfred Cortez is a 57 y.o. male who presents to the ED from home with a chief complaint of left toe injury.  Patient accidentally kicked a suitcase while going to bed.  Complains of pain and swelling to his left fifth toe.  Has no other complaints or injuries.     Past Medical History   Past Medical History:  Diagnosis Date   Diabetes mellitus without complication (HCC)    Hypertension      Active Problem List   Patient Active Problem List   Diagnosis Date Noted   Acute appendicitis 09/25/2021     Past Surgical History   Past Surgical History:  Procedure Laterality Date   HERNIA REPAIR     WISDOM TOOTH EXTRACTION  1998   XI ROBOTIC LAPAROSCOPIC ASSISTED APPENDECTOMY N/A 09/25/2021   Procedure: XI ROBOTIC LAPAROSCOPIC ASSISTED APPENDECTOMY;  Surgeon: Carolan Shiver, MD;  Location: [facility] ORS;  Service: General;  Laterality: N/A;     Home Medications   Prior to Admission medications   Medication Sig Start Date End Date Taking? Authorizing Provider  ibuprofen (ADVIL) 800 MG tablet Take 1 tablet (800 mg total) by mouth every 8 (eight) hours as needed for moderate pain. 11/10/22  Yes Irean Hong, MD  oxyCODONE-acetaminophen (PERCOCET/ROXICET) 5-325 MG tablet Take 1 tablet by mouth every 4 (four) hours as needed for severe pain. 11/10/22  Yes Irean Hong, MD  docusate sodium (COLACE) 100 MG capsule Take 1 tablet once or twice daily as needed for constipation while taking narcotic pain medicine 12/23/21   Loleta Rose, MD  Dulaglutide 3 MG/0.5ML SOPN Inject 3 mg into the skin every 7 (seven) days. 08/18/20   [provider]  glipiZIDE (GLUCOTROL) 10 MG tablet Take 10 mg by mouth daily. 07/21/21   [provider]  JARDIANCE 25 MG TABS tablet Take 25 mg by mouth daily. 04/22/18    [provider]  lisinopril (ZESTRIL) 40 MG tablet Take 40 mg by mouth daily. 08/01/21   [provider]  metFORMIN (GLUCOPHAGE) 1000 MG tablet Take 1,000 mg by mouth 2 (two) times daily with a meal. 01/18/18   [provider]  naltrexone (DEPADE) 50 MG tablet Take 50 mg by mouth daily. 09/22/21   [provider]  rosuvastatin (CRESTOR) 20 MG tablet Take 20 mg by mouth at bedtime. 08/25/21   [provider]     Allergies  Patient has no known allergies.   Family History  History reviewed. No pertinent family history.   Physical Exam  Triage Vital Signs: ED Triage Vitals  Enc Vitals Group     BP 11/09/22 2225 (!) 141/89     Pulse Rate 11/09/22 2225 99     Resp 11/09/22 2225 18     Temp 11/09/22 2224 98.5 F (36.9 C)     Temp Source 11/09/22 2224 Oral     SpO2 11/09/22 2225 99 %     Weight 11/09/22 2224 216 lb 3.2 oz (98.1 kg)     Height 11/09/22 2224 5\' 10"  (1.778 m)     Head Circumference --      Peak Flow --      Pain Score 11/09/22 2224 3     Pain Loc --      Pain Edu? --  Excl. in GC? --     Updated Vital Signs: BP (!) 141/89   Pulse 99   Temp 98.5 F (36.9 C) (Oral)   Resp 18   Ht 5\' 10"  (1.778 m)   Wt 98.1 kg   SpO2 99%   BMI 31.02 kg/m    General: Awake, no distress.  CV:  Good peripheral perfusion.  Resp:  Normal effort.  Abd:  No distention.  Other:  Left fifth toe swollen and tender to palpation.  2+ distal pulses.  Brisk, less than 5-second capillary refill.   ED Results / Procedures / Treatments  Labs (all labs ordered are listed, but only abnormal results are displayed) Labs Reviewed - No data to display   EKG  None   RADIOLOGY  Left fifth toe x-ray: Mildly displaced oblique fracture through the shaft of the proximal phalanx of the fifth digit   Official radiology report(s): DG Toe 5th Left  Result Date: 11/09/2022 CLINICAL DATA:  Injury. Complains of left toe pain after accidentally  kicking a suitcase while going to bed. EXAM: DG TOE 5TH LEFT COMPARISON:  None Available. FINDINGS: There is mildly displaced oblique fracture through the shaft of the proximal phalanx of the fifth digit with surrounding soft tissue swelling. IMPRESSION: Mildly displaced oblique fracture through the shaft of the proximal phalanx of the fifth digit. Electronically Signed   By: Larose Hires D.O.   On: 11/09/2022 22:45     PROCEDURES:  Critical Care performed: No  Procedures   MEDICATIONS ORDERED IN ED: Medications  ibuprofen (ADVIL) tablet 800 mg (has no administration in time range)  oxyCODONE-acetaminophen (PERCOCET/ROXICET) 5-325 MG per tablet 1 tablet (has no administration in time range)     IMPRESSION / MDM / ASSESSMENT AND PLAN / ED COURSE  I reviewed the triage vital signs and the nursing notes.                             57 year old male presenting with left fifth toe injury.  X-ray demonstrates oblique and displaced fracture of the proximal phalanx.  Will buddy tape toe, placed in podiatric shoe and referred to podiatry for outpatient follow-up.  Will administer NSAIDs, analgesia.  Strict return precautions given.  Patient and family member verbalized understanding agree with plan of care.  Patient's presentation is most consistent with acute, uncomplicated illness.   FINAL CLINICAL IMPRESSION(S) / ED DIAGNOSES   Final diagnoses:  Closed displaced fracture of proximal phalanx of lesser toe of left foot, initial encounter     Rx / DC Orders   ED Discharge Orders          Ordered    ibuprofen (ADVIL) 800 MG tablet  Every 8 hours PRN        11/10/22 0225    oxyCODONE-acetaminophen (PERCOCET/ROXICET) 5-325 MG tablet  Every 4 hours PRN        11/10/22 0225             Note:  This document was prepared using Dragon voice recognition software and may include unintentional dictation errors.   Irean Hong, MD 11/10/22 [id number]

## 2022-12-14 IMAGING — CR DG CHEST 2V
2 series · 2 of 2 positions shown · non-contrast
Comparison: None.

CLINICAL DATA: Chest pain, central and left lateral lasting several
seconds, associated left arm tingling

EXAM:
CHEST - 2 VIEW

[chest pa]
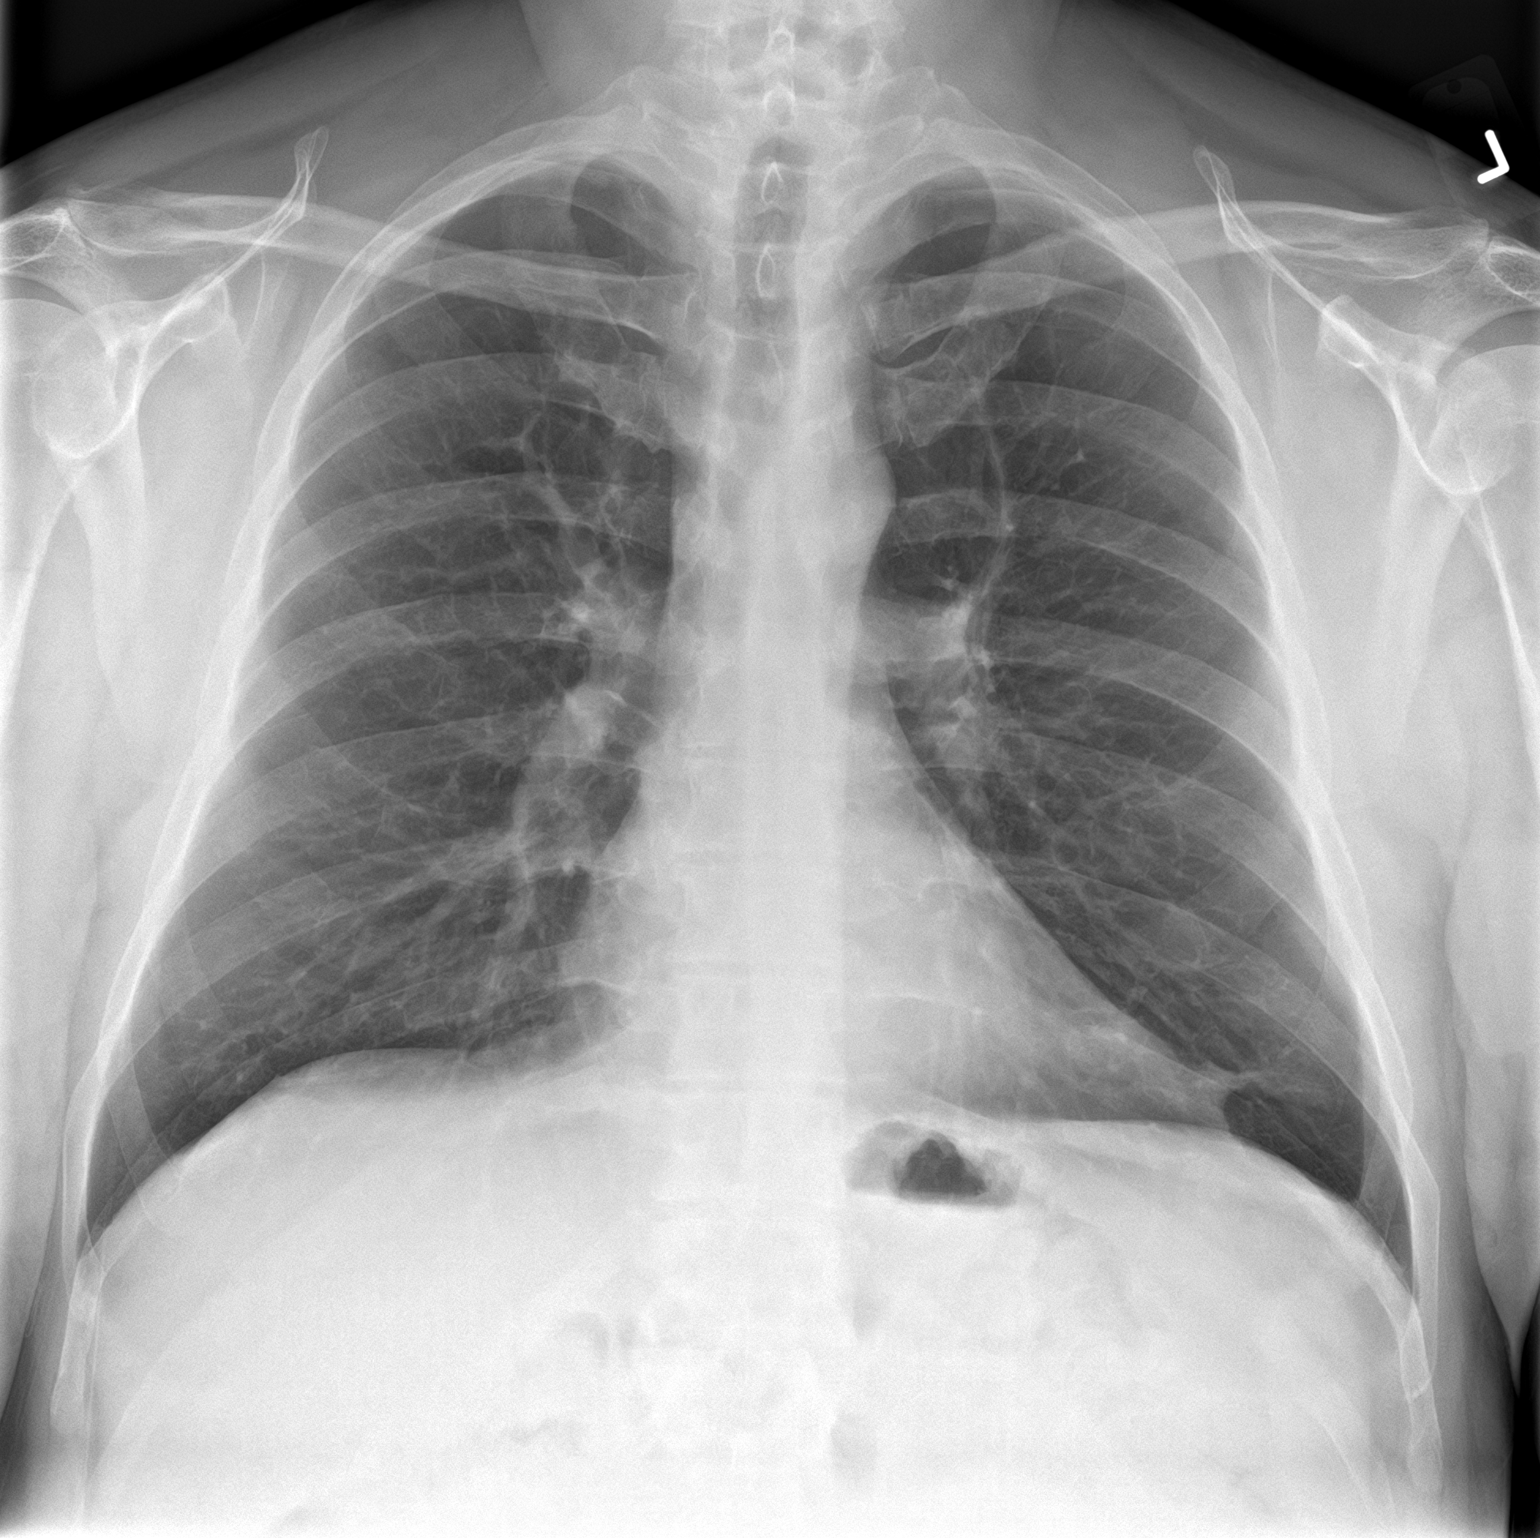

[chest lat]
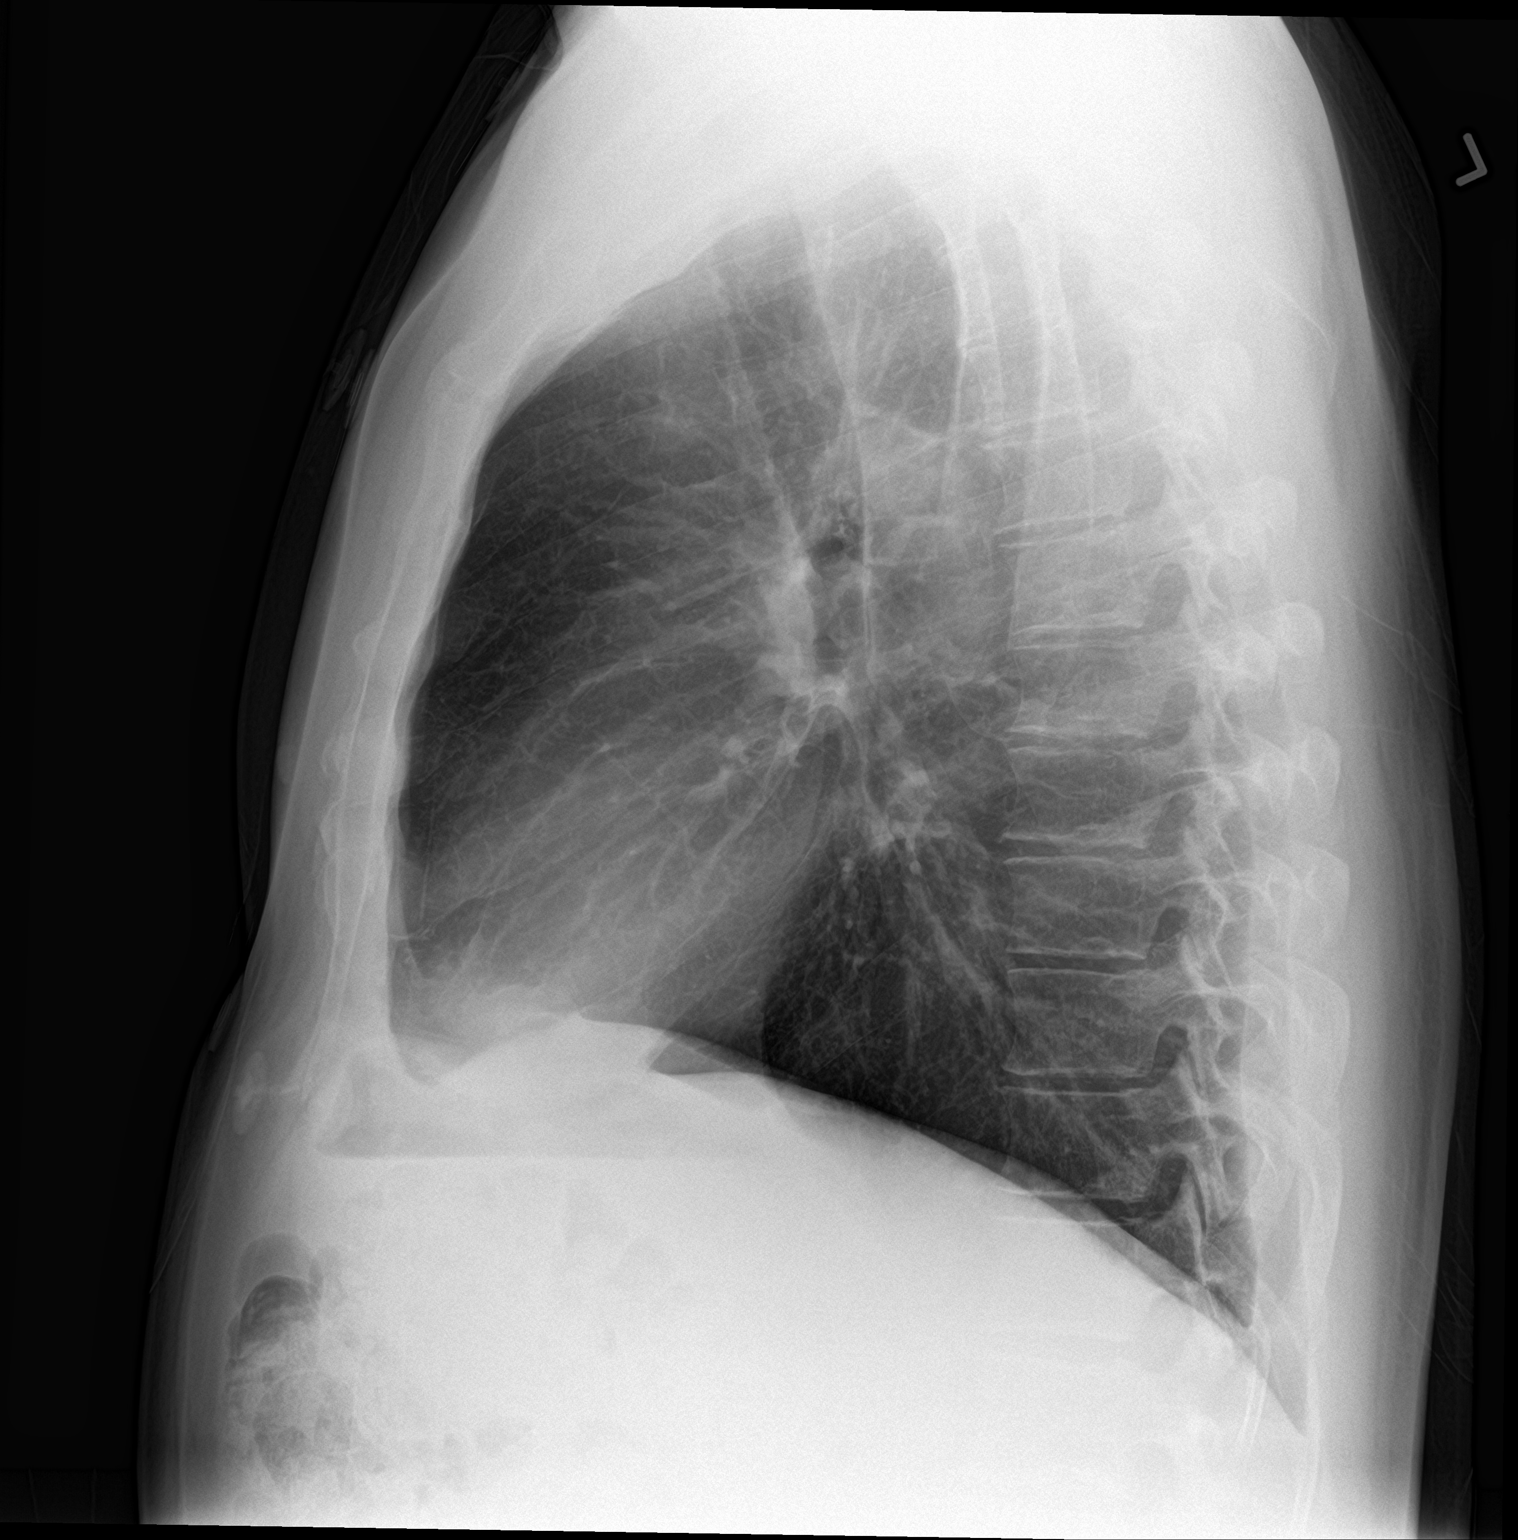

[2 of 2 positions shown; findings below may reference images not displayed]

FINDINGS: Bandlike opacity along the left heart border, likely subsegmental
atelectasis. No consolidation, features of edema, pneumothorax, or
effusion. The cardiomediastinal contours are unremarkable. No acute
osseous or soft tissue abnormality. Degenerative changes are present
in the imaged spine and shoulders.
IMPRESSION: Bandlike opacity along the left heart border, likely subsegmental
atelectasis in the lingula.

## 2024-05-04 IMAGING — CT CT ABD-PELV W/ CM
2 of 5 series · 15 of 46 positions shown, 17 images · IV contrast (APPLIED)
Comparison: None Available.

CLINICAL DATA: Right lower quadrant abdominal pain.

EXAM:
CT ABDOMEN AND PELVIS WITH CONTRAST
TECHNIQUE: Multidetector CT imaging of the abdomen and pelvis was performed
using the standard protocol following bolus administration of
intravenous contrast.

[Series 2: abdomen 5.0 · axial · 0.81mm/px · z∈[-1068,-573]mm · 12 of 115 slices shown, 14 images]
[im 8/115  soft-tissue]
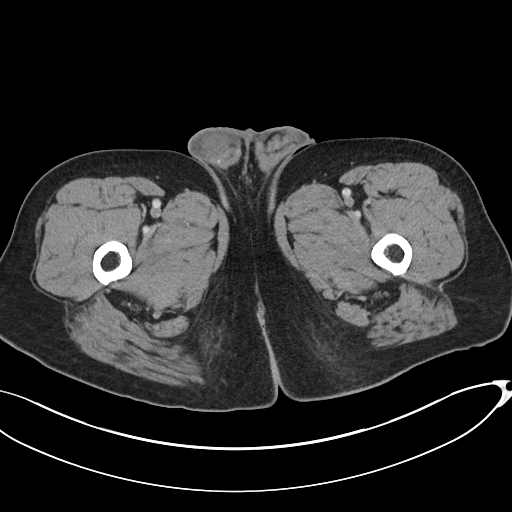
[im 8/115  bone]
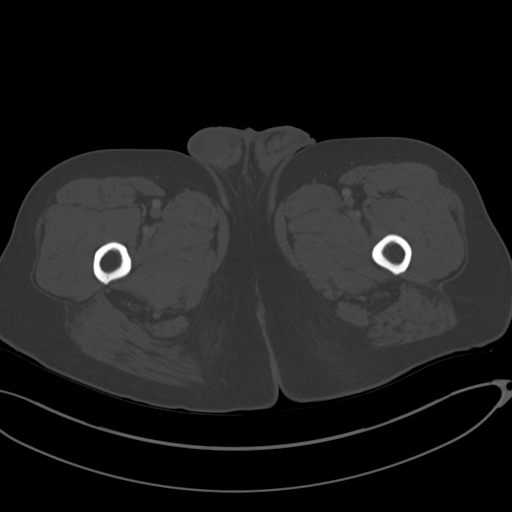
[im 15/115  soft-tissue]
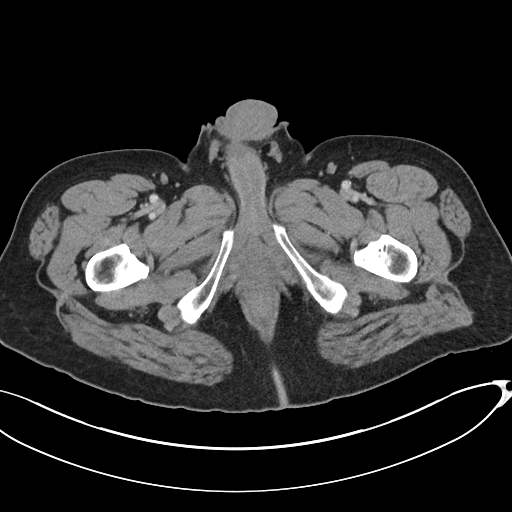
[im 29/115  soft-tissue]
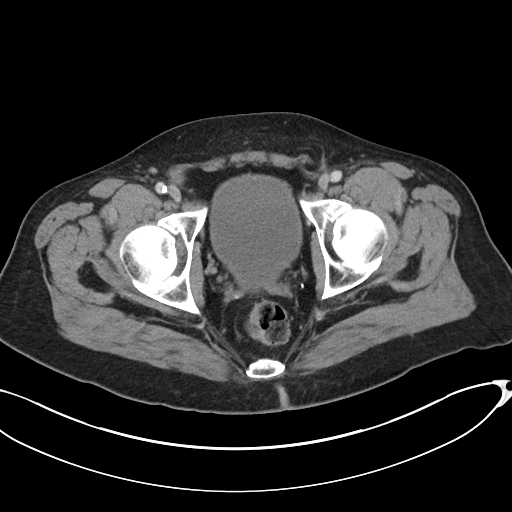
[im 36/115  soft-tissue]
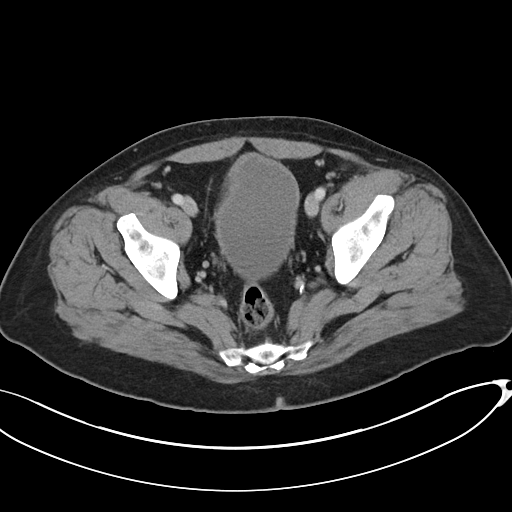
[im 43/115  soft-tissue]
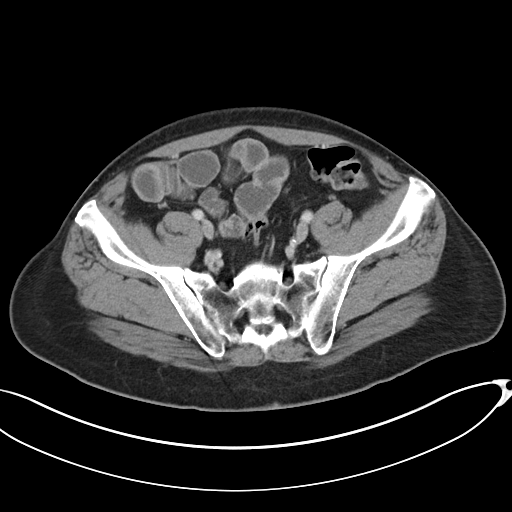
[im 50/115  soft-tissue]
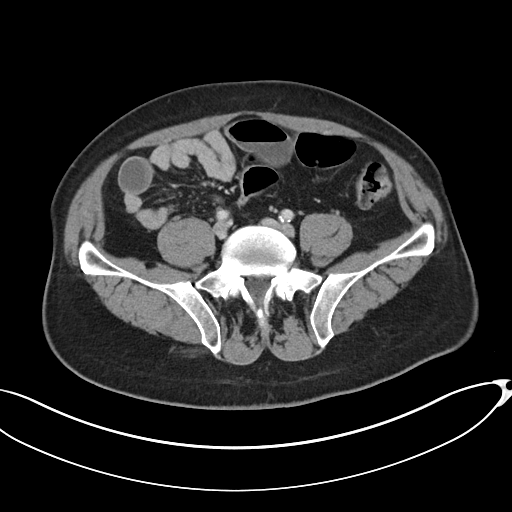
[im 65/115  soft-tissue]
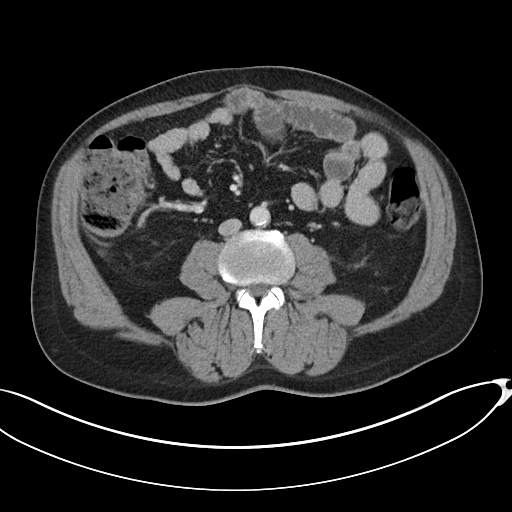
[im 72/115  soft-tissue]
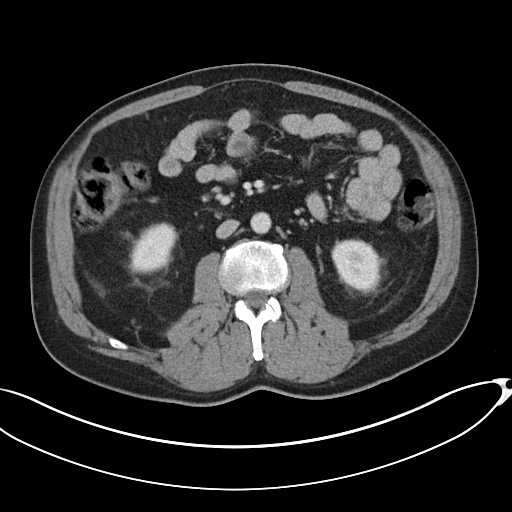
[im 79/115  soft-tissue]
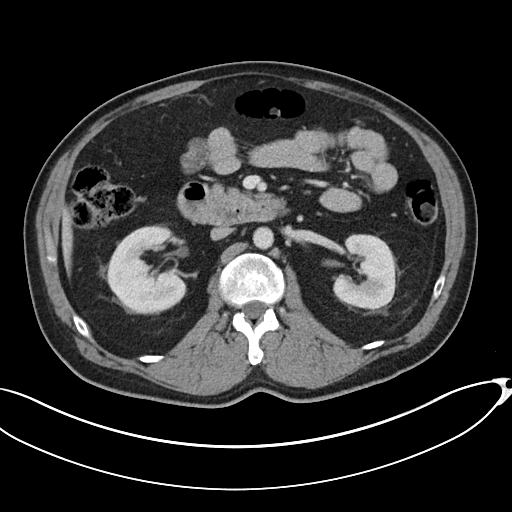
[im 79/115  bone]
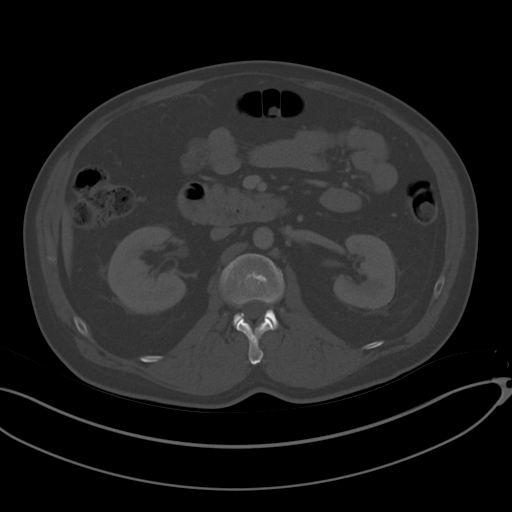
[im 86/115  soft-tissue]
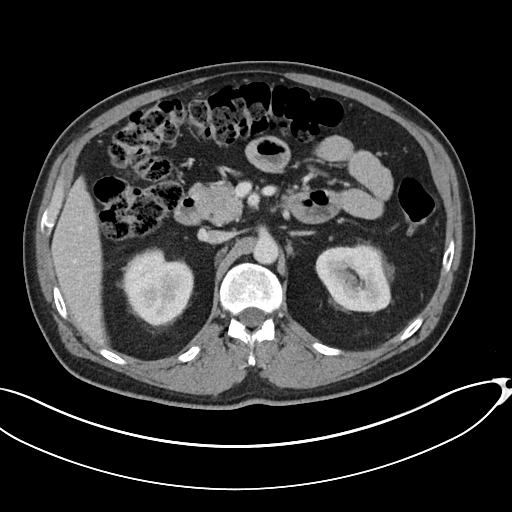
[im 100/115  soft-tissue]
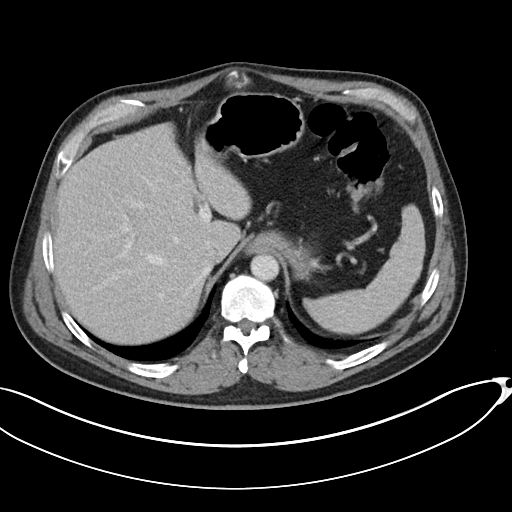
[im 107/115  soft-tissue]
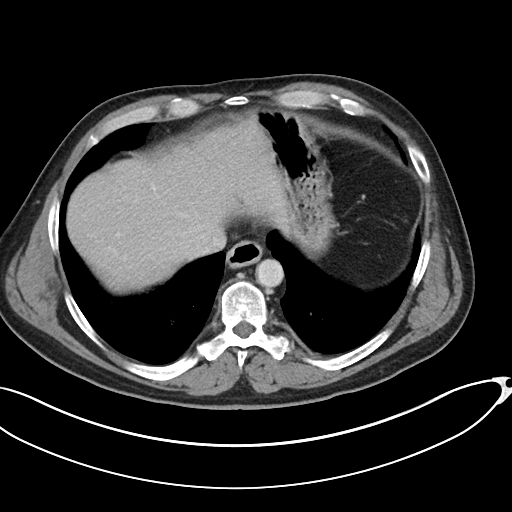

[Series 5: abdomen 3.0 mpr cor · coronal · 0.78mm/px · 3 of 97 slices shown]
[im 33/97  soft-tissue]
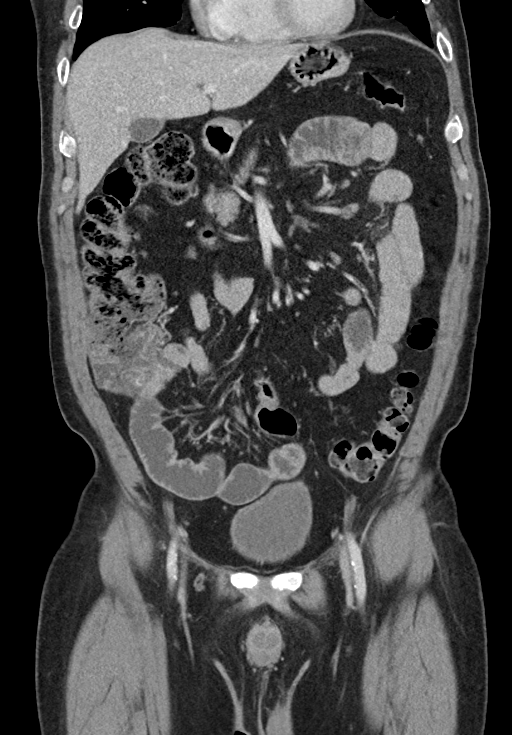
[im 43/97  soft-tissue]
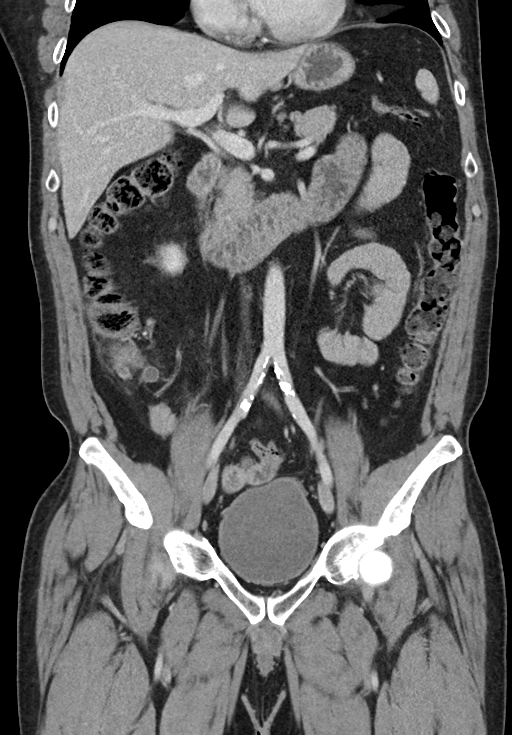
[im 54/97  soft-tissue]
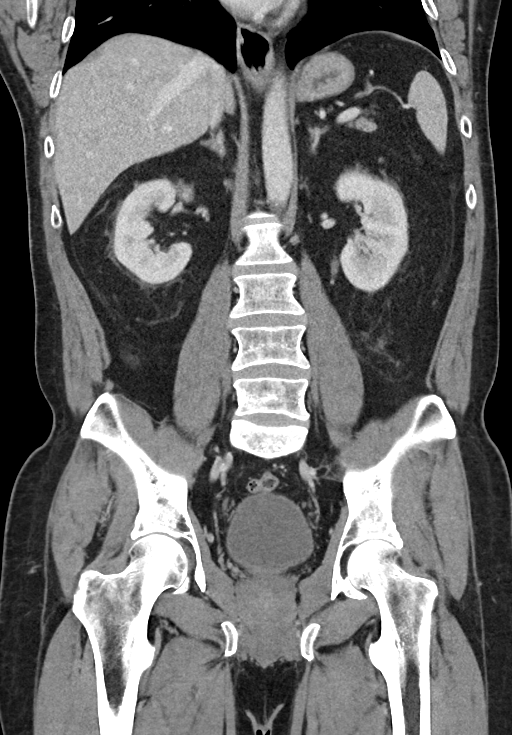

[15 of 46 positions shown; findings below may reference images not displayed]

RADIATION DOSE REDUCTION: This exam was performed according to the
departmental dose-optimization program which includes automated
exposure control, adjustment of the mA and/or kV according to
patient size and/or use of iterative reconstruction technique.

CONTRAST:  100mL OMNIPAQUE IOHEXOL 300 MG/ML  SOLN
FINDINGS: Lower chest: No acute abnormality.

Hepatobiliary: No focal liver abnormality is seen. No gallstones,
gallbladder wall thickening, or biliary dilatation.

Pancreas: Unremarkable. No pancreatic ductal dilatation or
surrounding inflammatory changes.

Spleen: Normal in size without focal abnormality.

Adrenals/Urinary Tract: Adrenal glands are unremarkable. Kidneys are
normal in size, without renal calculi or hydronephrosis. A 1.0 cm
diameter simple cyst is seen along the lateral aspect of the mid
right kidney. No additional follow-up or imaging is recommended.
Bladder is unremarkable.

Stomach/Bowel: Stomach is within normal limits. The appendix is
dilated and moderately inflamed. No associated perforation or
abscess is noted. No evidence of bowel dilatation.

Vascular/Lymphatic: Aortic atherosclerosis. No enlarged abdominal or
pelvic lymph nodes.

Reproductive: Prostate is unremarkable.

Other: A 2.8 cm x 2.0 cm fat containing left inguinal hernia is
seen. No abdominopelvic ascites.

Musculoskeletal: No acute or significant osseous findings.
IMPRESSION: Acute appendicitis without evidence of perforation or abscess.

Aortic Atherosclerosis ([id number]-Y38.8).
